# Patient Record
Sex: Female | Born: 1960 | Race: White | Hispanic: No | Marital: Married | Smoking: Never smoker
Health system: Southern US, Community
[De-identification: ages and names within clinical notes are randomized; demographics above are authoritative.]

## PROBLEM LIST (undated history)

## (undated) DIAGNOSIS — M1990 Unspecified osteoarthritis, unspecified site: Secondary | ICD-10-CM

## (undated) DIAGNOSIS — K219 Gastro-esophageal reflux disease without esophagitis: Secondary | ICD-10-CM

## (undated) HISTORY — PX: TONSILLECTOMY: SUR1361

---

## 2009-12-10 ENCOUNTER — Ambulatory Visit: Payer: Self-pay | Admitting: Gastroenterology

## 2009-12-24 ENCOUNTER — Ambulatory Visit: Payer: Self-pay | Admitting: Gastroenterology

## 2015-06-10 DIAGNOSIS — Z8639 Personal history of other endocrine, nutritional and metabolic disease: Secondary | ICD-10-CM | POA: Insufficient documentation

## 2016-11-21 ENCOUNTER — Inpatient Hospital Stay
Admission: EM | Admit: 2016-11-21 | Discharge: 2016-11-24 | DRG: 287 | Disposition: A | Discharge status: Home / Self Care | Payer: BLUE CROSS/BLUE SHIELD | Attending: Internal Medicine | Admitting: Internal Medicine

## 2016-11-21 ENCOUNTER — Encounter: Payer: Self-pay | Admitting: Emergency Medicine

## 2016-11-21 ENCOUNTER — Emergency Department: Payer: BLUE CROSS/BLUE SHIELD

## 2016-11-21 DIAGNOSIS — R079 Chest pain, unspecified: Secondary | ICD-10-CM | POA: Diagnosis present

## 2016-11-21 DIAGNOSIS — I400 Infective myocarditis: Secondary | ICD-10-CM | POA: Diagnosis not present

## 2016-11-21 DIAGNOSIS — I214 Non-ST elevation (NSTEMI) myocardial infarction: Secondary | ICD-10-CM | POA: Diagnosis present

## 2016-11-21 DIAGNOSIS — I441 Atrioventricular block, second degree: Secondary | ICD-10-CM | POA: Diagnosis present

## 2016-11-21 DIAGNOSIS — R109 Unspecified abdominal pain: Secondary | ICD-10-CM

## 2016-11-21 DIAGNOSIS — R030 Elevated blood-pressure reading, without diagnosis of hypertension: Secondary | ICD-10-CM | POA: Diagnosis present

## 2016-11-21 DIAGNOSIS — B9789 Other viral agents as the cause of diseases classified elsewhere: Secondary | ICD-10-CM | POA: Diagnosis present

## 2016-11-21 LAB — URINALYSIS, COMPLETE (UACMP) WITH MICROSCOPIC
Bacteria, UA: NONE SEEN
Bilirubin Urine: NEGATIVE
GLUCOSE, UA: NEGATIVE mg/dL
Hgb urine dipstick: NEGATIVE
Ketones, ur: 80 mg/dL — AB
Nitrite: NEGATIVE
PROTEIN: NEGATIVE mg/dL
Specific Gravity, Urine: 1.017 (ref 1.005–1.030)
pH: 7 (ref 5.0–8.0)

## 2016-11-21 LAB — CBC
HEMATOCRIT: 43 % (ref 35.0–47.0)
Hemoglobin: 14.3 g/dL (ref 12.0–16.0)
MCH: 29 pg (ref 26.0–34.0)
MCHC: 33.3 g/dL (ref 32.0–36.0)
MCV: 87 fL (ref 80.0–100.0)
Platelets: 206 10*3/uL (ref 150–440)
RBC: 4.94 MIL/uL (ref 3.80–5.20)
RDW: 12.9 % (ref 11.5–14.5)
WBC: 4.5 10*3/uL (ref 3.6–11.0)

## 2016-11-21 LAB — BASIC METABOLIC PANEL
Anion gap: 9 (ref 5–15)
BUN: 11 mg/dL (ref 6–20)
CALCIUM: 9.1 mg/dL (ref 8.9–10.3)
CO2: 27 mmol/L (ref 22–32)
CREATININE: 0.72 mg/dL (ref 0.44–1.00)
Chloride: 103 mmol/L (ref 101–111)
GFR calc Af Amer: >60 mL/min (ref >60)
GLUCOSE: 112 mg/dL — AB (ref 65–99)
Potassium: 3.8 mmol/L (ref 3.5–5.1)
Sodium: 139 mmol/L (ref 135–145)

## 2016-11-21 LAB — HEPATIC FUNCTION PANEL
ALT: 31 U/L (ref 14–54)
AST: 34 U/L (ref 15–41)
Albumin: 4.3 g/dL (ref 3.5–5.0)
Alkaline Phosphatase: 48 U/L (ref 38–126)
BILIRUBIN DIRECT: 0.2 mg/dL (ref 0.1–0.5)
Indirect Bilirubin: 1 mg/dL — ABNORMAL HIGH (ref 0.3–0.9)
Total Bilirubin: 1.2 mg/dL (ref 0.3–1.2)
Total Protein: 7.4 g/dL (ref 6.5–8.1)

## 2016-11-21 LAB — TROPONIN I
Troponin I: 0.22 ng/mL (ref <0.03)
Troponin I: 2.59 ng/mL (ref <0.03)

## 2016-11-21 LAB — LIPASE, BLOOD: Lipase: 33 U/L (ref 11–51)

## 2016-11-21 LAB — TSH: TSH: 0.471 u[IU]/mL (ref 0.350–4.500)

## 2016-11-21 MED ORDER — ASPIRIN 81 MG PO CHEW
CHEWABLE_TABLET | ORAL | Status: AC
  Administered 2016-11-21: 324 mg via ORAL
  Filled 2016-11-21: qty 4

## 2016-11-21 MED ORDER — ENOXAPARIN SODIUM 100 MG/ML ~~LOC~~ SOLN
SUBCUTANEOUS | Status: AC
  Filled 2016-11-21: qty 1

## 2016-11-21 MED ORDER — ACETAMINOPHEN 325 MG PO TABS
650.0000 mg | ORAL_TABLET | Freq: Four times a day (QID) | ORAL | Status: DC | PRN
  Administered 2016-11-22 – 2016-11-23 (×3): 650 mg via ORAL
  Filled 2016-11-21 (×3): qty 2

## 2016-11-21 MED ORDER — ENOXAPARIN SODIUM 80 MG/0.8ML ~~LOC~~ SOLN
1.0000 mg/kg | Freq: Two times a day (BID) | SUBCUTANEOUS | Status: DC
  Administered 2016-11-21: 75 mg via SUBCUTANEOUS

## 2016-11-21 MED ORDER — METOPROLOL TARTRATE 25 MG PO TABS
ORAL_TABLET | ORAL | Status: AC
  Administered 2016-11-21: 12.5 mg via ORAL
  Filled 2016-11-21: qty 1

## 2016-11-21 MED ORDER — ONDANSETRON HCL 4 MG/2ML IJ SOLN
4.0000 mg | Freq: Four times a day (QID) | INTRAMUSCULAR | Status: DC | PRN
  Administered 2016-11-21: 4 mg via INTRAVENOUS
  Filled 2016-11-21 (×2): qty 2

## 2016-11-21 MED ORDER — SODIUM CHLORIDE 0.9% FLUSH
3.0000 mL | INTRAVENOUS | Status: DC | PRN
  Administered 2016-11-21: 3 mL via INTRAVENOUS
  Filled 2016-11-21: qty 3

## 2016-11-21 MED ORDER — METOPROLOL TARTRATE 25 MG PO TABS
12.5000 mg | ORAL_TABLET | Freq: Two times a day (BID) | ORAL | Status: DC
  Administered 2016-11-21 – 2016-11-23 (×3): 12.5 mg via ORAL
  Filled 2016-11-21 (×5): qty 1

## 2016-11-21 MED ORDER — ACETAMINOPHEN 325 MG PO TABS: 650.0000 mg | ORAL_TABLET | Freq: Four times a day (QID) | ORAL | Status: DC | PRN

## 2016-11-21 MED ORDER — NITROGLYCERIN 0.4 MG SL SUBL
0.4000 mg | SUBLINGUAL_TABLET | SUBLINGUAL | Status: DC | PRN
Start: 2016-11-21 — End: 2016-11-24

## 2016-11-21 MED ORDER — ASPIRIN EC 325 MG PO TBEC
325.0000 mg | DELAYED_RELEASE_TABLET | Freq: Every day | ORAL | Status: DC
  Administered 2016-11-22 – 2016-11-24 (×3): 325 mg via ORAL
  Filled 2016-11-21 (×3): qty 1

## 2016-11-21 MED ORDER — SODIUM CHLORIDE 0.9% FLUSH
3.0000 mL | Freq: Two times a day (BID) | INTRAVENOUS | Status: DC
  Administered 2016-11-22 (×2): 3 mL via INTRAVENOUS

## 2016-11-21 MED ORDER — ONDANSETRON HCL 4 MG/2ML IJ SOLN
4.0000 mg | Freq: Once | INTRAMUSCULAR | Status: AC
  Administered 2016-11-21: 4 mg via INTRAVENOUS
  Filled 2016-11-21: qty 2

## 2016-11-21 MED ORDER — ENOXAPARIN SODIUM 80 MG/0.8ML ~~LOC~~ SOLN
1.0000 mg/kg | Freq: Two times a day (BID) | SUBCUTANEOUS | Status: DC
  Administered 2016-11-22 – 2016-11-23 (×3): 75 mg via SUBCUTANEOUS
  Filled 2016-11-21 (×3): qty 0.8

## 2016-11-21 MED ORDER — ACETAMINOPHEN 650 MG RE SUPP: 650.0000 mg | Freq: Four times a day (QID) | RECTAL | Status: DC | PRN

## 2016-11-21 MED ORDER — SODIUM CHLORIDE 0.9 % IV SOLN: 250.0000 mL | INTRAVENOUS | Status: DC | PRN

## 2016-11-21 MED ORDER — ASPIRIN 81 MG PO CHEW
324.0000 mg | CHEWABLE_TABLET | Freq: Once | ORAL | Status: AC
Start: 2016-11-21 — End: 2016-11-21
  Administered 2016-11-21: 324 mg via ORAL

## 2016-11-21 MED ORDER — SODIUM CHLORIDE 0.9% FLUSH: 3.0000 mL | Freq: Two times a day (BID) | INTRAVENOUS | Status: DC

## 2016-11-21 MED ORDER — SODIUM CHLORIDE 0.9 % IV BOLUS (SEPSIS)
1000.0000 mL | Freq: Once | INTRAVENOUS | Status: AC
  Administered 2016-11-21: 1000 mL via INTRAVENOUS

## 2016-11-21 NOTE — ED Notes (Signed)
Patient transported to Ultrasound 

## 2016-11-21 NOTE — ED Provider Notes (Addendum)
[facility] Emergency Department Provider Note  ____________________________________________   I have reviewed the triage vital signs and the nursing notes.   HISTORY  Chief Complaint Flank Pain and Chest Pain    HPI Rhonda Monroe is a 56 y.o. female was completed a healthy, does not take any medications not smoke does not use recreational drugs, does not drink alcohol, she has no allergies. She states that she has had multiple different symptoms over the last several days. She had flulike symptoms with runny nose and sore throat cough and that has persisted but seems to be gradually improving. She denies any shortness of breath or leg swelling. She states that she also has had some pain in the right flank and underneath some minimal discomfort in the right upper quadrant which seem to radiate towards her chest earlier today. It happened around 10:30. The pain was a a sharp/pressure sensation. It is gone now. She was still having when she had her EKG however. It began around noon. She denies any pleuritic component to this, no leg swelling, no personal or family history of PE or DVT no recent travel, no birth control her exogenous estrogens, she has no pleuritic pain or shortness of breath. It happened at rest. Patient states that she has still had some pain in her bilateral lower back but no dysuria no urinary symptoms of any variety. Patient states that the pain in her chest was better after she drank some water. She did vomit this morning 2 and she thinks that may also cause some of her discomfort. In short, multiple different complaints from body aches to runny nose to cough to chest pain to abdominal pain to flank pain      No past medical history on file.  There are no active problems to display for this patient.   Past Surgical History:  Procedure Laterality Date  . CESAREAN SECTION    . TONSILLECTOMY      Prior to Admission medications   Not on  File    Allergies Patient has no known allergies.  No family history on file.  Social History Social History  Substance Use Topics  . Smoking status: Never Smoker  . Smokeless tobacco: Not on file  . Alcohol use No    Review of Systems Constitutional: No fever/chills Eyes: No visual changes. ENT: Slight sore throat. No stiff neck no neck pain Cardiovascular: See history of present illness Respiratory: Denies shortness of breath. Gastrointestinal:   Positive vomiting.  No diarrhea.  No constipation. Genitourinary: Negative for dysuria. Musculoskeletal: Negative lower extremity swelling Skin: Negative for rash. Neurological: Negative for severe headaches, focal weakness or numbness. 10-point ROS otherwise negative.  ____________________________________________   PHYSICAL EXAM:  VITAL SIGNS: ED Triage Vitals  Enc Vitals Group     BP 11/21/16 1218 (!) 143/91     Pulse Rate 11/21/16 1218 78     Resp 11/21/16 1218 18     Temp 11/21/16 1218 98.2 F (36.8 C)     Temp Source 11/21/16 1218 Oral     SpO2 11/21/16 1218 100 %     Weight 11/21/16 1221 170 lb (77.1 kg)     Height 11/21/16 1221 5\' 5"  (1.651 m)     Head Circumference --      Peak Flow --      Pain Score 11/21/16 1221 7     Pain Loc --      Pain Edu? --  Excl. in [place]? --     Constitutional: Alert and oriented. Well appearing and in no acute distress.Vision quite well-appearing with no evidence of any discomfort or medical illness nose at this time. Eyes: Conjunctivae are normal. PERRL. EOMI. Head: Atraumatic. Nose: Slight clear congestion/rhinnorhea. Mouth/Throat: Mucous membranes are moist.  Oropharynx non-erythematous. Neck: No stridor.   Nontender with no meningismus Cardiovascular: Normal rate, regular rhythm. Grossly normal heart sounds.  Good peripheral circulation. Respiratory: Normal respiratory effort.  No retractions. Lungs CTAB. Abdominal: Soft and mild epigastric discomfort which reproduces  her pain also some slight sensation of "fullness" according to the patient on the right upper quadrant. No distention. No guarding no rebound Back:  There is no focal tenderness or step off.  there is no midline tenderness there are no lesions noted. there is no CVA tenderness Musculoskeletal: No lower extremity tenderness, no upper extremity tenderness. No joint effusions, no DVT signs strong distal pulses no edema Neurologic:  Normal speech and language. No gross focal neurologic deficits are appreciated.  Skin:  Skin is warm, dry and intact. No rash noted. Psychiatric: Mood and affect are normal. Speech and behavior are normal.  ____________________________________________   LABS (all labs ordered are listed, but only abnormal results are displayed)  Labs Reviewed  BASIC METABOLIC PANEL - Abnormal; Notable for the following:       Result Value   Glucose, Bld 112 (*)    All other components within normal limits  URINALYSIS, COMPLETE (UACMP) WITH MICROSCOPIC - Abnormal; Notable for the following:    Color, Urine YELLOW (*)    APPearance CLEAR (*)    Ketones, ur 80 (*)    Leukocytes, UA SMALL (*)    Squamous Epithelial / LPF 0-5 (*)    All other components within normal limits  HEPATIC FUNCTION PANEL - Abnormal; Notable for the following:    Indirect Bilirubin 1.0 (*)    All other components within normal limits  CBC  TROPONIN I  LIPASE, BLOOD   ____________________________________________  EKG  I personally interpreted any EKGs ordered by me or triage Normal sinus rhythm rate 77 bpm no acute ST elevation or acute ST depression normal axis unremarkable EKG ____________________________________________  RADIOLOGY  I reviewed any imaging ordered by me or triage that were performed during my shift and, if possible, patient and/or family made aware of any abnormal findings. ____________________________________________   PROCEDURES  Procedure(s) performed:  None  Procedures  Critical Care performed: None  ____________________________________________   INITIAL IMPRESSION / ASSESSMENT AND PLAN / ED COURSE  Pertinent labs & imaging results that were available during my care of the patient were reviewed by me and considered in my medical decision making (see chart for details).  Patient with a host of different complaints today. Body aches, flulike symptoms, runny nose, sore throat, slight cough, lower back pain more on the right than the left, as well as a epigastric and right upper quadrant abdominal discomfort that went towards her chest. She is concerned that it might be her gallbladder. Patient's blood work and vital signs are reassuring. We have added liver function tests and lipase. She has minimal discomfort or epigastric region which does seem to reproduce the pain that she was having. Very low suspicion for ACS given normal EKG normal troponin and no risk factors with a very atypical pain at rest which seemed to come from her stomach. Nonetheless we'll send a second cardiac enzyme.At this time, there does not appear to be clinical evidence to  support the diagnosis of pulmonary embolus, dissection, myocarditis, endocarditis, pericarditis, pericardial tamponade, acute coronary syndrome, pneumothorax, pneumonia, or any other acute intrathoracic pathology that will require admission or acute intervention. Nor is there evidence of any significant intra-abdominal pathology causing this discomfort. Patient with no significant discomfort to her abdomen at this time a limited possibility exists that this could be gallbladder disease which is been nonradiating brace directions, we'll obtain an ultrasound. We see no evidence of anything aside from some (in her urine aside from trace leukocytes. Certainly no evidence of pyelonephritis, or renal colic, abdomen is benign no evidence of appendicitis. We will monitor the patient closely and  reassess.Marland Kitchen  ----------------------------------------- 5:23 PM on 11/21/2016 -----------------------------------------  She remains asymptomatic however her second troponin was 0000000 which is certainly elevated from baseline. She has no chest pain at this time. I do not suspect PE or dissection. She did not have any shortness of breath with this and was not tearing pain did not go to her back. She does remain chest pain-free nonetheless given this change, we will have her admitted I have discussed with the hospitalist and they agree.   ____________________________________________   FINAL CLINICAL IMPRESSION(S) / ED DIAGNOSES  Final diagnoses:  Abdominal pain      This chart was dictated using voice recognition software.  Despite best efforts to proofread,  errors can occur which can change meaning.      Schuyler Amor, MD 11/21/16 [place], MD 11/21/16 [phone]

## 2016-11-21 NOTE — ED Triage Notes (Signed)
Pt c/o flu like symptoms and R sided flank pain that started on Sunday. Pt c/o nausea with the flank pain. States she went to [facility] this morning and had sudden onset substernal chest pain with no radiation, c/o some dizziness and light headed-ness at this time.

## 2016-11-21 NOTE — H&P (Signed)
[facility] [place] NAME: Rhonda Monroe    MR#:  [id number]  DATE OF BIRTH:  1960-10-25  DATE OF ADMISSION:  11/21/2016  PRIMARY CARE PHYSICIAN: Rusty Aus, MD   REQUESTING/REFERRING PHYSICIAN: Leilani Merl MD  CHIEF COMPLAINT:   Chief Complaint  Patient presents with  . Flank Pain  . Chest Pain    HISTORY OF PRESENT ILLNESS: Rhonda Monroe  is a 56 y.o. female with No medical history according to her and not on any medications who states that she had flulike illness since last Sunday. Which included stuffy nose and congestion. As well as fevers. She also states that she had pain in her kidneys. Her fevers have resolved after 2-3 days. However subsequently she became nauseous. She did not have any vomiting. Then she started having again flank pain and then started having chest pressure today. Therefore she came to the ED. Now the chest pressure is resolved. Her troponin was 0 and then subsequently elevated  .22. Therefore we are asked to admit the patient. She states that she she has had substernal chest pressure occasionally but is very rare. She is pretty active and does not get these symptoms with exertion. PAST MEDICAL HISTORY:  No past medical history on file.  PAST SURGICAL HISTORY:  Past Surgical History:  Procedure Laterality Date  . CESAREAN SECTION    . TONSILLECTOMY      SOCIAL HISTORY:  Social History  Substance Use Topics  . Smoking status: Never Smoker  . Smokeless tobacco: Never Used  . Alcohol use No    FAMILY HISTORY:  Family History  Problem Relation Age of Onset  . Dementia Mother     DRUG ALLERGIES: No Known Allergies  REVIEW OF SYSTEMS:   CONSTITUTIONAL: No fever, fatigue or weakness.  EYES: No blurred or double vision.  EARS, NOSE, AND THROAT: No tinnitus or ear pain.  RESPIRATORY: No cough, shortness of breath, wheezing or hemoptysis.  CARDIOVASCULAR: Positive chest pain earlier, orthopnea, edema.   GASTROINTESTINAL: No nausea, vomiting, diarrhea or abdominal pain.  GENITOURINARY: No dysuria, hematuria.  ENDOCRINE: No polyuria, nocturia,  HEMATOLOGY: No anemia, easy bruising or bleeding SKIN: No rash or lesion. MUSCULOSKELETAL: No joint pain or arthritis.   NEUROLOGIC: No tingling, numbness, weakness.  PSYCHIATRY: No anxiety or depression.   MEDICATIONS AT HOME:  Prior to Admission medications   Not on File      PHYSICAL EXAMINATION:   VITAL SIGNS: Blood pressure 134/85, pulse 74, temperature 98.1 F (36.7 C), temperature source Oral, resp. rate 18, height 5\' 5"  (1.651 m), weight 170 lb (77.1 kg), SpO2 98 %.  GENERAL:  56 y.o.-year-old patient lying in the bed with no acute distress.  EYES: Pupils equal, round, reactive to light and accommodation. No scleral icterus. Extraocular muscles intact.  HEENT: Head atraumatic, normocephalic. Oropharynx and nasopharynx clear.  NECK:  Supple, no jugular venous distention. No thyroid enlargement, no tenderness.  LUNGS: Normal breath sounds bilaterally, no wheezing, rales,rhonchi or crepitation. No use of accessory muscles of respiration.  CARDIOVASCULAR: S1, S2 normal. No murmurs, rubs, or gallops.  ABDOMEN: Soft, nontender, nondistended. Bowel sounds present. No organomegaly or mass.  EXTREMITIES: No pedal edema, cyanosis, or clubbing.  NEUROLOGIC: Cranial nerves II through XII are intact. Muscle strength 5/5 in all extremities. Sensation intact. Gait not checked.  PSYCHIATRIC: The patient is alert and oriented x 3.  SKIN: No obvious rash, lesion, or ulcer.   LABORATORY PANEL:   CBC  Recent Labs Lab 11/21/16 1219  WBC 4.5  HGB 14.3  HCT 43.0  PLT 206  MCV 87.0  MCH 29.0  MCHC 33.3  RDW 12.9   ------------------------------------------------------------------------------------------------------------------  Chemistries   Recent Labs Lab 11/21/16 1219  NA 139  K 3.8  CL 103  CO2 27  GLUCOSE 112*  BUN 11   CREATININE 0.72  CALCIUM 9.1  AST 34  ALT 31  ALKPHOS 48  BILITOT 1.2   ------------------------------------------------------------------------------------------------------------------ estimated creatinine clearance is 79.6 mL/min (by C-G formula based on SCr of 0.72 mg/dL). ------------------------------------------------------------------------------------------------------------------ No results for input(s): TSH, T4TOTAL, T3FREE, THYROIDAB in the last 72 hours.  Invalid input(s): FREET3   Coagulation profile No results for input(s): INR, PROTIME in the last 168 hours. ------------------------------------------------------------------------------------------------------------------- No results for input(s): DDIMER in the last 72 hours. -------------------------------------------------------------------------------------------------------------------  Cardiac Enzymes  Recent Labs Lab 11/21/16 1219 11/21/16 1628  TROPONINI <0.03 0.22*   ------------------------------------------------------------------------------------------------------------------ Invalid input(s): POCBNP  ---------------------------------------------------------------------------------------------------------------  Urinalysis    Component Value Date/Time   COLORURINE YELLOW (A) 11/21/2016 1219   APPEARANCEUR CLEAR (A) 11/21/2016 1219   LABSPEC 1.017 11/21/2016 1219   PHURINE 7.0 11/21/2016 [place] 11/21/2016 1219   HGBUR NEGATIVE 11/21/2016 1219   BILIRUBINUR NEGATIVE 11/21/2016 1219   KETONESUR 80 (A) 11/21/2016 1219   PROTEINUR NEGATIVE 11/21/2016 1219   NITRITE NEGATIVE 11/21/2016 1219   LEUKOCYTESUR SMALL (A) 11/21/2016 1219     RADIOLOGY: Dg Chest 2 View  Result Date: 11/21/2016 CLINICAL DATA:  Pt c/o flu like symptoms and R sided flank pain that started on Sunday. Pt c/o nausea with the flank pain. EXAM: CHEST  2 VIEW COMPARISON:  None. FINDINGS: The heart size and  mediastinal contours are within normal limits. Both lungs are clear. The visualized skeletal structures are unremarkable. IMPRESSION: No active cardiopulmonary disease. Electronically Signed   By: Kathreen Devoid   On: 11/21/2016 12:52   US Abdomen Limited Ruq  Result Date: 11/21/2016 CLINICAL DATA:  Abdominal pain EXAM: US ABDOMEN LIMITED - RIGHT UPPER QUADRANT COMPARISON:  None. FINDINGS: Gallbladder: No gallstones or wall thickening visualized. No sonographic Murphy sign noted by sonographer. Common bile duct: Diameter: 2.5 mm Liver: No focal lesion identified. Within normal limits in parenchymal echogenicity. IMPRESSION: Normal study. Electronically Signed   By: Dorise Bullion III M.D   On: 11/21/2016 15:56    EKG: Orders placed or performed during the hospital encounter of 11/21/16  . EKG 12-Lead  . EKG 12-Lead  . ED EKG within 10 minutes  . ED EKG within 10 minutes    IMPRESSION AND PLAN: Patient is a 56 year old presented with chest pain noted to have troponin elevation  1. Chest pain with elevated troponin Could be related to underlying coronary artery disease and angina. We'll treat with aspirin, full dose Lovenox, low-dose beta blocker I will ask cardiology to see Obtain echocardiogram of the heart We'll place under observation overnight Check a fasting lipid panel, hemoglobin A1c   All the records are reviewed and case discussed with ED provider. Management plans discussed with the patient, family and they are in agreement.  CODE STATUS: Code Status History    This patient does not have a recorded code status. Please follow your organizational policy for patients in this situation.       TOTAL TIME TAKING CARE OF THIS PATIENT: 50 minutes.    Dustin Flock M.D on 11/21/2016 at 5:57 PM  Between 7am to 6pm - Pager - [phone]  After 6pm go  to [email or web address] - password [email or web address] [facility]  Office  [phone]  CC: Primary care physician;  Rusty Aus, MD

## 2016-11-21 NOTE — Progress Notes (Signed)
Dr. Jannifer Franklin notified of elevated troponin 2.59. Orders placed

## 2016-11-21 NOTE — ED Notes (Signed)
Lab alerted me of abnormal Trop of 0.22, MD notified.

## 2016-11-22 ENCOUNTER — Encounter: Payer: Self-pay | Admitting: Internal Medicine

## 2016-11-22 ENCOUNTER — Observation Stay
Admit: 2016-11-22 | Discharge: 2016-11-22 | Disposition: A | Discharge status: Home / Self Care | Payer: BLUE CROSS/BLUE SHIELD | Attending: Internal Medicine | Admitting: Internal Medicine

## 2016-11-22 DIAGNOSIS — I400 Infective myocarditis: Secondary | ICD-10-CM | POA: Diagnosis present

## 2016-11-22 DIAGNOSIS — I441 Atrioventricular block, second degree: Secondary | ICD-10-CM | POA: Diagnosis present

## 2016-11-22 DIAGNOSIS — B9789 Other viral agents as the cause of diseases classified elsewhere: Secondary | ICD-10-CM | POA: Diagnosis present

## 2016-11-22 DIAGNOSIS — R030 Elevated blood-pressure reading, without diagnosis of hypertension: Secondary | ICD-10-CM | POA: Diagnosis present

## 2016-11-22 DIAGNOSIS — I214 Non-ST elevation (NSTEMI) myocardial infarction: Secondary | ICD-10-CM | POA: Diagnosis present

## 2016-11-22 DIAGNOSIS — R109 Unspecified abdominal pain: Secondary | ICD-10-CM | POA: Diagnosis present

## 2016-11-22 LAB — CBC
HEMATOCRIT: 40.4 % (ref 35.0–47.0)
Hemoglobin: 14 g/dL (ref 12.0–16.0)
MCH: 29.9 pg (ref 26.0–34.0)
MCHC: 34.6 g/dL (ref 32.0–36.0)
MCV: 86.6 fL (ref 80.0–100.0)
Platelets: 199 10*3/uL (ref 150–440)
RBC: 4.67 MIL/uL (ref 3.80–5.20)
RDW: 12.8 % (ref 11.5–14.5)
WBC: 5.2 10*3/uL (ref 3.6–11.0)

## 2016-11-22 LAB — LIPID PANEL
CHOLESTEROL: 152 mg/dL (ref 0–200)
HDL: 34 mg/dL — ABNORMAL LOW (ref >40)
LDL CALC: 97 mg/dL (ref 0–99)
TRIGLYCERIDES: 106 mg/dL (ref <150)
Total CHOL/HDL Ratio: 4.5 RATIO
VLDL: 21 mg/dL (ref 0–40)

## 2016-11-22 LAB — BASIC METABOLIC PANEL
ANION GAP: 9 (ref 5–15)
BUN: 8 mg/dL (ref 6–20)
CALCIUM: 8.6 mg/dL — AB (ref 8.9–10.3)
CO2: 25 mmol/L (ref 22–32)
Chloride: 104 mmol/L (ref 101–111)
Creatinine, Ser: 0.66 mg/dL (ref 0.44–1.00)
Glucose, Bld: 100 mg/dL — ABNORMAL HIGH (ref 65–99)
Potassium: 3.7 mmol/L (ref 3.5–5.1)
Sodium: 138 mmol/L (ref 135–145)

## 2016-11-22 LAB — TROPONIN I
TROPONIN I: 5.3 ng/mL — AB (ref <0.03)
TROPONIN I: 5.93 ng/mL — AB (ref <0.03)
TROPONIN I: 4.43 ng/mL — AB (ref <0.03)

## 2016-11-22 MED ORDER — ATORVASTATIN CALCIUM 20 MG PO TABS
40.0000 mg | ORAL_TABLET | Freq: Every day | ORAL | Status: DC
  Administered 2016-11-22 – 2016-11-23 (×2): 40 mg via ORAL
  Filled 2016-11-22 (×2): qty 2

## 2016-11-22 MED ORDER — SODIUM CHLORIDE 0.9 % WEIGHT BASED INFUSION: 1.0000 mL/kg/h | INTRAVENOUS | Status: DC

## 2016-11-22 MED ORDER — ASPIRIN 81 MG PO CHEW
81.0000 mg | CHEWABLE_TABLET | ORAL | Status: AC
  Administered 2016-11-23: 81 mg via ORAL
  Filled 2016-11-22: qty 1

## 2016-11-22 MED ORDER — SODIUM CHLORIDE 0.9 % WEIGHT BASED INFUSION: 3.0000 mL/kg/h | INTRAVENOUS | Status: DC

## 2016-11-22 MED ORDER — SODIUM CHLORIDE 0.9 % IV SOLN: 250.0000 mL | INTRAVENOUS | Status: DC | PRN

## 2016-11-22 MED ORDER — METOCLOPRAMIDE HCL 5 MG/ML IJ SOLN
5.0000 mg | Freq: Three times a day (TID) | INTRAMUSCULAR | Status: DC | PRN
  Administered 2016-11-22: 5 mg via INTRAVENOUS
  Filled 2016-11-22: qty 2

## 2016-11-22 MED ORDER — SODIUM CHLORIDE 0.9% FLUSH: 3.0000 mL | INTRAVENOUS | Status: DC | PRN

## 2016-11-22 MED ORDER — SODIUM CHLORIDE 0.9% FLUSH
3.0000 mL | Freq: Two times a day (BID) | INTRAVENOUS | Status: DC
  Administered 2016-11-22: 3 mL via INTRAVENOUS

## 2016-11-22 NOTE — Consult Note (Signed)
[facility] Consultation Note  Patient ID: Rhonda Monroe, MRN: [id number], DOB/AGE: [birth year] 56 y.o. Admit date: 11/21/2016   Date of Consult: 11/22/2016 Primary Physician: Rusty Aus, MD Primary Cardiologist: None  Chief Complaint:  Chief Complaint  Patient presents with  . Flank Pain  . Chest Pain   Reason for Consult: chest pain  HPI: 56 y.o. female with no previous history of cardiovascular disease or any significant history of cardiovascular risk factors who is had a viral type illness with weakness fatigue and other concerns but no evidence of fever over the last week. With this she had some significant lower back pain as well as increase in central chest discomfort with and without shortness of breath and with and without activity over a several hour period. The patient was seen in the emergency room with the and EKG showing normal sinus rhythm and full resolution of her chest discomfort with rest. She was placed on heparin for which she has had full resolution of symptoms and is hemodynamically stable. The patient did have an elevated troponin of 5.9 consistent with non-ST elevation myocardial infarction. Currently she is stable and feeling well  No past medical history on file.    Surgical History:  Past Surgical History:  Procedure Laterality Date  . CESAREAN SECTION    . TONSILLECTOMY       Home Meds: Prior to Admission medications   Not on File    Inpatient Medications:  . aspirin EC  325 mg Oral Daily  . enoxaparin (LOVENOX) injection  1 mg/kg Subcutaneous Q12H  . metoprolol tartrate  12.5 mg Oral BID  . sodium chloride flush  3 mL Intravenous Q12H  . sodium chloride flush  3 mL Intravenous Q12H     Allergies: No Known Allergies  Social History   Social History  . Marital status: Married    Spouse name: N/A  . Number of children: N/A  . Years of education: N/A   Occupational History  . Not on file.   Social History Main Topics  .  Smoking status: Never Smoker  . Smokeless tobacco: Never Used  . Alcohol use No  . Drug use: No  . Sexual activity: Not on file   Other Topics Concern  . Not on file   Social History Narrative  . No narrative on file     Family History  Problem Relation Age of Onset  . Dementia Mother      Review of Systems Positive for Chest pain shortness of breath weakness fatigue Negative for: General:  chills, fever, night sweats or weight changes.  Cardiovascular: PND orthopnea syncope dizziness  Dermatological skin lesions rashes Respiratory: Cough congestion Urologic: Frequent urination urination at night and hematuria Abdominal: negative for nausea, vomiting, diarrhea, bright red blood per rectum, melena, or hematemesis Neurologic: negative for visual changes, and/or hearing changes  All other systems reviewed and are otherwise negative except as noted above.  Labs:  Recent Labs  11/21/16 1219 11/21/16 1628 11/21/16 2045 11/22/16 0306  TROPONINI <0.03 0.22* 2.59* 5.30*   Lab Results  Component Value Date   WBC 5.2 11/22/2016   HGB 14.0 11/22/2016   HCT 40.4 11/22/2016   MCV 86.6 11/22/2016   PLT 199 11/22/2016    Recent Labs Lab 11/21/16 1219 11/22/16 0306  NA 139 138  K 3.8 3.7  CL 103 104  CO2 27 25  BUN 11 8  CREATININE 0.72 0.66  CALCIUM 9.1 8.6*  PROT 7.4  --  BILITOT 1.2  --   ALKPHOS 48  --   ALT 31  --   AST 34  --   GLUCOSE 112* 100*   Lab Results  Component Value Date   CHOL 152 11/22/2016   HDL 34 (L) 11/22/2016   LDLCALC 97 11/22/2016   TRIG 106 11/22/2016   No results found for: DDIMER  Radiology/Studies:  Dg Chest 2 View  Result Date: 11/21/2016 CLINICAL DATA:  Pt c/o flu like symptoms and R sided flank pain that started on Sunday. Pt c/o nausea with the flank pain. EXAM: CHEST  2 VIEW COMPARISON:  None. FINDINGS: The heart size and mediastinal contours are within normal limits. Both lungs are clear. The visualized skeletal  structures are unremarkable. IMPRESSION: No active cardiopulmonary disease. Electronically Signed   By: Kathreen Devoid   On: 11/21/2016 12:52   US Abdomen Limited Ruq  Result Date: 11/21/2016 CLINICAL DATA:  Abdominal pain EXAM: US ABDOMEN LIMITED - RIGHT UPPER QUADRANT COMPARISON:  None. FINDINGS: Gallbladder: No gallstones or wall thickening visualized. No sonographic Murphy sign noted by sonographer. Common bile duct: Diameter: 2.5 mm Liver: No focal lesion identified. Within normal limits in parenchymal echogenicity. IMPRESSION: Normal study. Electronically Signed   By: Dorise Bullion III M.D   On: 11/21/2016 15:56    EKG: Normal sinus rhythm  Weights: Filed Weights   11/21/16 1221 11/21/16 1938  Weight: 77.1 kg (170 lb) 75.7 kg (166 lb 12.8 oz)     Physical Exam: Blood pressure 139/83, pulse 70, temperature 98.1 F (36.7 C), temperature source Oral, resp. rate 18, height 5\' 5"  (1.651 m), weight 75.7 kg (166 lb 12.8 oz), SpO2 98 %. Body mass index is 27.76 kg/m. General: Well developed, well nourished, in no acute distress. Head eyes ears nose throat: Normocephalic, atraumatic, sclera non-icteric, no xanthomas, nares are without discharge. No apparent thyromegaly and/or mass  Lungs: Normal respiratory effort.  no wheezes, no rales, no rhonchi.  Heart: RRR with normal S1 S2. no murmur gallop, no rub, PMI is normal size and placement, carotid upstroke normal without bruit, jugular venous pressure is normal Abdomen: Soft, non-tender, non-distended with normoactive bowel sounds. No hepatomegaly. No rebound/guarding. No obvious abdominal masses. Abdominal aorta is normal size without bruit Extremities: No edema. no cyanosis, no clubbing, no ulcers  Peripheral : 2+ bilateral upper extremity pulses, 2+ bilateral femoral pulses, 2+ bilateral dorsal pedal pulse Neuro: Alert and oriented. No facial asymmetry. No focal deficit. Moves all extremities spontaneously. Musculoskeletal: Normal muscle  tone without kyphosis Psych:  Responds to questions appropriately with a normal affect.    Assessment: 56 year old female with no cardiovascular risk factors or history with elevated troponin after episode of chest discomfort possibly due to stress-induced cardiomyopathy, viral cardiomyopathy, and/or non-ST elevation myocardial infarction  Plan: 1. Continue heparin for further risk reduction in cardiovascular event 2. No additional medication management at this time due to normal blood pressure and full resolution of symptoms 3. Proceed to cardiac catheterization to assess coronary anatomy and further treatment thereof is necessary. Patient understands the risk and benefits of cardiac catheterization. This includes a possibility of death stroke heart attack infection bleeding or blood clot. She is at low risk for conscious sedation  Signed, Corey Skains M.D. [facility] 11/22/2016, 7:01 AM

## 2016-11-22 NOTE — Progress Notes (Signed)
*  PRELIMINARY RESULTS* Echocardiogram 2D Echocardiogram has been performed.  Rhonda Monroe 11/22/2016, 8:48 AM

## 2016-11-22 NOTE — Progress Notes (Signed)
Informed by central monitoring pt had 4.5 sec pause. Dr. Marcille Blanco Aware and reviewed strip.

## 2016-11-22 NOTE — Progress Notes (Signed)
Call from [place] monitoring following up regarding pause. Documented strip as ventricular standstill,

## 2016-11-22 NOTE — Progress Notes (Signed)
[facility] [place] NAME: Rhonda Monroe    MR#:  [id number]  DATE OF BIRTH:  1960-11-30  SUBJECTIVE:  CHIEF COMPLAINT:   Chief Complaint  Patient presents with  . Flank Pain  . Chest Pain   No chest pain today Nausea present without vomiting or abd pain  REVIEW OF SYSTEMS:    Review of Systems  Constitutional: Negative for chills and fever.  HENT: Negative for sore throat.   Eyes: Negative for blurred vision, double vision and pain.  Respiratory: Negative for cough, hemoptysis, shortness of breath and wheezing.   Cardiovascular: Negative for chest pain, palpitations, orthopnea and leg swelling.  Gastrointestinal: Positive for nausea. Negative for abdominal pain, constipation, diarrhea, heartburn and vomiting.  Genitourinary: Negative for dysuria and hematuria.  Musculoskeletal: Negative for back pain and joint pain.  Skin: Negative for rash.  Neurological: Negative for sensory change, speech change, focal weakness and headaches.  Endo/Heme/Allergies: Does not bruise/bleed easily.  Psychiatric/Behavioral: Negative for depression. The patient is not nervous/anxious.     DRUG ALLERGIES:  No Known Allergies  VITALS:  Blood pressure 123/79, pulse 70, temperature 97.7 F (36.5 C), temperature source Oral, resp. rate 18, height 5\' 5"  (1.651 m), weight 75.7 kg (166 lb 12.8 oz), SpO2 96 %.  PHYSICAL EXAMINATION:   Physical Exam  GENERAL:  56 y.o.-year-old patient lying in the bed with no acute distress.  EYES: Pupils equal, round, reactive to light and accommodation. No scleral icterus. Extraocular muscles intact.  HEENT: Head atraumatic, normocephalic. Oropharynx and nasopharynx clear.  NECK:  Supple, no jugular venous distention. No thyroid enlargement, no tenderness.  LUNGS: Normal breath sounds bilaterally, no wheezing, rales, rhonchi. No use of accessory muscles of respiration.  CARDIOVASCULAR: S1, S2 normal. No murmurs, rubs, or  gallops.  ABDOMEN: Soft, nontender, nondistended. Bowel sounds present. No organomegaly or mass.  EXTREMITIES: No cyanosis, clubbing or edema b/l.    NEUROLOGIC: Cranial nerves II through XII are intact. No focal Motor or sensory deficits b/l.   PSYCHIATRIC: The patient is alert and oriented x 3.  SKIN: No obvious rash, lesion, or ulcer.   LABORATORY PANEL:   CBC  Recent Labs Lab 11/22/16 0306  WBC 5.2  HGB 14.0  HCT 40.4  PLT 199   ------------------------------------------------------------------------------------------------------------------ Chemistries   Recent Labs Lab 11/21/16 1219 11/22/16 0306  NA 139 138  K 3.8 3.7  CL 103 104  CO2 27 25  GLUCOSE 112* 100*  BUN 11 8  CREATININE 0.72 0.66  CALCIUM 9.1 8.6*  AST 34  --   ALT 31  --   ALKPHOS 48  --   BILITOT 1.2  --    ------------------------------------------------------------------------------------------------------------------  Cardiac Enzymes  Recent Labs Lab 11/22/16 0851  TROPONINI 5.93*   ------------------------------------------------------------------------------------------------------------------  RADIOLOGY:  Dg Chest 2 View  Result Date: 11/21/2016 CLINICAL DATA:  Pt c/o flu like symptoms and R sided flank pain that started on Sunday. Pt c/o nausea with the flank pain. EXAM: CHEST  2 VIEW COMPARISON:  None. FINDINGS: The heart size and mediastinal contours are within normal limits. Both lungs are clear. The visualized skeletal structures are unremarkable. IMPRESSION: No active cardiopulmonary disease. Electronically Signed   By: Kathreen Devoid   On: 11/21/2016 12:52   US Abdomen Limited Ruq  Result Date: 11/21/2016 CLINICAL DATA:  Abdominal pain EXAM: US ABDOMEN LIMITED - RIGHT UPPER QUADRANT COMPARISON:  None. FINDINGS: Gallbladder: No gallstones or wall thickening visualized. No sonographic Murphy sign  noted by sonographer. Common bile duct: Diameter: 2.5 mm Liver: No focal lesion  identified. Within normal limits in parenchymal echogenicity. IMPRESSION: Normal study. Electronically Signed   By: Dorise Bullion III M.D   On: 11/21/2016 15:56     ASSESSMENT AND PLAN:   * NSTEMI - Troponin trending up ASA, Statin Lovenox Appreciate cardiology input Cath tomorrow Likely atypical presentation with Nausea  * Elevated blood pressure Monitor  All the records are reviewed and case discussed with Care Management/Social Workerr. Management plans discussed with the patient, family and they are in agreement.  CODE STATUS: FULL CODE  DVT Prophylaxis: SCDs  TOTAL TIME TAKING CARE OF THIS PATIENT: 30 minutes.   POSSIBLE D/C IN 1-2 DAYS, DEPENDING ON CLINICAL CONDITION.  Hillary Bow R M.D on 11/22/2016 at 11:23 AM  Between 7am to 6pm - Pager - [phone]  After 6pm go to [email or web address] - password [email or web address] [facility]  Office  [phone]  CC: Primary care physician; Rusty Aus, MD  Note: This dictation was prepared with Dragon dictation along with smaller phrase technology. Any transcriptional errors that result from this process are unintentional.

## 2016-11-22 NOTE — Care Management Obs Status (Signed)
[facility] NOTIFICATION   Patient Details  Name: ALIZAH PION MRN: [id number] Date of Birth: 20-Oct-1960   Medicare Observation Status Notification Given:  No (No MOON letter given per no Medicare)    Cierah Crader A, RN 11/22/2016, 3:31 PM

## 2016-11-23 ENCOUNTER — Encounter
Admission: EM | Disposition: A | Discharge status: Home / Self Care | Payer: Self-pay | Source: Home / Self Care | Attending: Internal Medicine

## 2016-11-23 HISTORY — PX: LEFT HEART CATH: CATH118248

## 2016-11-23 LAB — HEMOGLOBIN A1C
Hgb A1c MFr Bld: 5.5 % (ref 4.8–5.6)
MEAN PLASMA GLUCOSE: 111 mg/dL

## 2016-11-23 LAB — ECHOCARDIOGRAM COMPLETE
Height: 65 in
Weight: 2668.8 oz

## 2016-11-23 LAB — URINE CULTURE

## 2016-11-23 SURGERY — LEFT HEART CATH
Anesthesia: Moderate Sedation

## 2016-11-23 MED ORDER — METOPROLOL TARTRATE 25 MG PO TABS: 12.5000 mg | ORAL_TABLET | Freq: Two times a day (BID) | ORAL | 0 refills | Status: DC

## 2016-11-23 MED ORDER — MIDAZOLAM HCL 2 MG/2ML IJ SOLN
INTRAMUSCULAR | Status: DC | PRN
  Administered 2016-11-23: 1 mg via INTRAVENOUS

## 2016-11-23 MED ORDER — HEPARIN (PORCINE) IN NACL 2-0.9 UNIT/ML-% IJ SOLN
INTRAMUSCULAR | Status: AC
  Filled 2016-11-23: qty 500

## 2016-11-23 MED ORDER — ONDANSETRON HCL 4 MG/2ML IJ SOLN: 4.0000 mg | Freq: Four times a day (QID) | INTRAMUSCULAR | Status: DC | PRN

## 2016-11-23 MED ORDER — IOPAMIDOL (ISOVUE-300) INJECTION 61%
INTRAVENOUS | Status: DC | PRN
  Administered 2016-11-23: 105 mL via INTRA_ARTERIAL

## 2016-11-23 MED ORDER — ASPIRIN 325 MG PO TBEC: 325.0000 mg | DELAYED_RELEASE_TABLET | Freq: Every day | ORAL | 0 refills | Status: DC

## 2016-11-23 MED ORDER — FENTANYL CITRATE (PF) 100 MCG/2ML IJ SOLN
INTRAMUSCULAR | Status: DC | PRN
  Administered 2016-11-23: 25 ug via INTRAVENOUS

## 2016-11-23 MED ORDER — SODIUM CHLORIDE 0.9 % WEIGHT BASED INFUSION
1.0000 mL/kg/h | INTRAVENOUS | Status: AC
  Administered 2016-11-23: 1 mL/kg/h via INTRAVENOUS

## 2016-11-23 MED ORDER — FENTANYL CITRATE (PF) 100 MCG/2ML IJ SOLN
INTRAMUSCULAR | Status: AC
  Filled 2016-11-23: qty 2

## 2016-11-23 MED ORDER — ACETAMINOPHEN 325 MG PO TABS: 650.0000 mg | ORAL_TABLET | ORAL | Status: DC | PRN

## 2016-11-23 MED ORDER — SODIUM CHLORIDE 0.9% FLUSH
3.0000 mL | Freq: Two times a day (BID) | INTRAVENOUS | Status: DC
  Administered 2016-11-23 – 2016-11-24 (×2): 3 mL via INTRAVENOUS

## 2016-11-23 MED ORDER — SODIUM CHLORIDE 0.9% FLUSH
3.0000 mL | INTRAVENOUS | Status: DC | PRN
  Administered 2016-11-23: 3 mL via INTRAVENOUS
  Filled 2016-11-23: qty 3

## 2016-11-23 MED ORDER — ATORVASTATIN CALCIUM 40 MG PO TABS: 20.0000 mg | ORAL_TABLET | Freq: Every day | ORAL | 0 refills | Status: DC

## 2016-11-23 MED ORDER — MIDAZOLAM HCL 2 MG/2ML IJ SOLN
INTRAMUSCULAR | Status: AC
  Filled 2016-11-23: qty 2

## 2016-11-23 MED ORDER — SODIUM CHLORIDE 0.9 % IV SOLN: 250.0000 mL | INTRAVENOUS | Status: DC | PRN

## 2016-11-23 SURGICAL SUPPLY — 9 items
CATH 5FR JL4 DIAGNOSTIC (CATHETERS) ×2 IMPLANT
CATH 5FR JR4 DIAGNOSTIC (CATHETERS) ×3 IMPLANT
CATH INFINITI 5FR ANG PIGTAIL (CATHETERS) ×3 IMPLANT
DEVICE CLOSURE MYNXGRIP 5F (Vascular Products) ×3 IMPLANT
KIT MANI 3VAL PERCEP (MISCELLANEOUS) ×3 IMPLANT
NEEDLE PERC 18GX7CM (NEEDLE) ×3 IMPLANT
PACK CARDIAC CATH (CUSTOM PROCEDURE TRAY) ×6 IMPLANT
SHEATH PINNACLE 5F 10CM (SHEATH) ×3 IMPLANT
WIRE EMERALD 3MM-J .035X150CM (WIRE) ×3 IMPLANT

## 2016-11-23 NOTE — Progress Notes (Signed)
Report to [facility] telemetry.  Check right groin for bleeding or hematoma.  Patient will be on bedrest for 1 hours post sheath pull---out of bed at 09:30.  Bilateral pulses are 2's DP's.Marland Kitchen

## 2016-11-23 NOTE — Progress Notes (Signed)
[facility] Cardiology [facility] Encounter Note  Patient: Rhonda Monroe / Admit Date: 11/21/2016 / Date of Encounter: 11/23/2016, 8:09 AM   Subjective: Patient feeling well today. No further episodes of chest discomfort. Patient hemodynamically stable. Troponin improving. Echocardiogram showing normal LV systolic function ejection fraction of 55% with normal valve structure and function Catheterization showing normal LV systolic function with ejection fraction of 55% and normal coronary arteries Review of Systems: Positive for: None Negative for: Vision change, hearing change, syncope, dizziness, nausea, vomiting,diarrhea, bloody stool, stomach pain, cough, congestion, diaphoresis, urinary frequency, urinary pain,skin lesions, skin rashes Others previously listed  Objective: Telemetry: Sinus rhythm Physical Exam: Blood pressure (!) 126/4, pulse 85, temperature 98.7 F (37.1 C), resp. rate 18, height 5\' 5"  (1.651 m), weight 75.7 kg (166 lb 12.8 oz), SpO2 99 %. Body mass index is 27.76 kg/m. General: Well developed, well nourished, in no acute distress. Head: Normocephalic, atraumatic, sclera non-icteric, no xanthomas, nares are without discharge. Neck: No apparent masses Lungs: Normal respirations with no wheezes, no rhonchi, no rales , no crackles   Heart: Regular rate and rhythm, normal S1 S2, no murmur, no rub, no gallop, PMI is normal size and placement, carotid upstroke normal without bruit, jugular venous pressure normal Abdomen: Soft, non-tender, non-distended with normoactive bowel sounds. No hepatosplenomegaly. Abdominal aorta is normal size without bruit Extremities: No edema, no clubbing, no cyanosis, no ulcers,  Peripheral: 2+ radial, 2+ femoral, 2+ dorsal pedal pulses Neuro: Alert and oriented. Moves all extremities spontaneously. Psych:  Responds to questions appropriately with a normal affect.   Intake/Output Summary (Last 24 hours) at 11/23/16 0809 Last data filed at  11/23/16 0500  Gross per 24 hour  Intake              240 ml  Output             1200 ml  Net             -960 ml    Inpatient Medications:  . [MAR Hold] aspirin EC  325 mg Oral Daily  . [MAR Hold] atorvastatin  40 mg Oral q1800  . [MAR Hold] enoxaparin (LOVENOX) injection  1 mg/kg Subcutaneous Q12H  . [MAR Hold] metoprolol tartrate  12.5 mg Oral BID  . [MAR Hold] sodium chloride flush  3 mL Intravenous Q12H  . [facility] Hold] sodium chloride flush  3 mL Intravenous Q12H  . sodium chloride flush  3 mL Intravenous Q12H   Infusions:  . sodium chloride      Labs:  Recent Labs  11/21/16 1219 11/22/16 0306  NA 139 138  K 3.8 3.7  CL 103 104  CO2 27 25  GLUCOSE 112* 100*  BUN 11 8  CREATININE 0.72 0.66  CALCIUM 9.1 8.6*    Recent Labs  11/21/16 1219  AST 34  ALT 31  ALKPHOS 48  BILITOT 1.2  PROT 7.4  ALBUMIN 4.3    Recent Labs  11/21/16 1219 11/22/16 0306  WBC 4.5 5.2  HGB 14.3 14.0  HCT 43.0 40.4  MCV 87.0 86.6  PLT 206 199    Recent Labs  11/21/16 2045 11/22/16 0306 11/22/16 0851 11/22/16 1459  TROPONINI 2.59* 5.30* 5.93* 4.43*   Invalid input(s): POCBNP No results for input(s): HGBA1C in the last 72 hours.   Weights: Filed Weights   11/21/16 1221 11/21/16 1938  Weight: 77.1 kg (170 lb) 75.7 kg (166 lb 12.8 oz)     Radiology/Studies:  Dg Chest 2 View  Result Date: 11/21/2016 CLINICAL DATA:  Pt c/o flu like symptoms and R sided flank pain that started on Sunday. Pt c/o nausea with the flank pain. EXAM: CHEST  2 VIEW COMPARISON:  None. FINDINGS: The heart size and mediastinal contours are within normal limits. Both lungs are clear. The visualized skeletal structures are unremarkable. IMPRESSION: No active cardiopulmonary disease. Electronically Signed   By: Kathreen Devoid   On: 11/21/2016 12:52   US Abdomen Limited Ruq  Result Date: 11/21/2016 CLINICAL DATA:  Abdominal pain EXAM: US ABDOMEN LIMITED - RIGHT UPPER QUADRANT COMPARISON:  None.  FINDINGS: Gallbladder: No gallstones or wall thickening visualized. No sonographic Murphy sign noted by sonographer. Common bile duct: Diameter: 2.5 mm Liver: No focal lesion identified. Within normal limits in parenchymal echogenicity. IMPRESSION: Normal study. Electronically Signed   By: Dorise Bullion III M.D   On: 11/21/2016 15:56     Assessment and Recommendation  56 y.o. female with no previous cardiovascular history or significant risk factors cardiovascular disease having episodes of chest discomfort shortness of breath and possible viral myocarditis with elevation of troponin with echocardiogram showing normal LV systolic function without evidence of valvular heart disease and catheterization showing normal coronary arteries 1. Continue aspirin for further risk reduction cardiovascular event 2. Low-dose metoprolol for possible non-ST elevation myocardial infarction secondary to viral myocarditis 3. Begin ambulation and follow for improvements and possible discharge to home. Patient has had a long discussion of the cardiac rehabilitation and other physical activity.  Signed, Serafina Royals M.D. FACC

## 2016-11-23 NOTE — Progress Notes (Addendum)
Reviewed the Telemetry strips with Dr. Darvin Neighbours - episodes of 3-4 beats of complete heart block

## 2016-11-23 NOTE — Progress Notes (Signed)
[facility] Cardiology [facility] Encounter Note  Patient: Rhonda Monroe / Admit Date: 11/21/2016 / Date of Encounter: 11/23/2016, 12:55 PM   Subjective: Patient feeling well today. No further episodes of chest discomfort. Patient hemodynamically stable. Troponin improving. Echocardiogram showing normal LV systolic function ejection fraction of 55% with normal valve structure and function Catheterization showing normal LV systolic function with ejection fraction of 55% and normal coronary arteries Review of tele strips suggests second degree type I block and episodes of complete heart block as well There has been no new sx of this  And may be secondary to meds  Review of Systems: Positive for: None Negative for: Vision change, hearing change, syncope, dizziness, nausea, vomiting,diarrhea, bloody stool, stomach pain, cough, congestion, diaphoresis, urinary frequency, urinary pain,skin lesions, skin rashes Others previously listed  Objective: Telemetry: Sinus rhythm with occasional variable heart block Physical Exam: Blood pressure 105/71, pulse 66, temperature 98.6 F (37 C), temperature source Oral, resp. rate 16, height 5\' 5"  (1.651 m), weight 75.7 kg (166 lb 12.8 oz), SpO2 98 %. Body mass index is 27.76 kg/m. General: Well developed, well nourished, in no acute distress. Head: Normocephalic, atraumatic, sclera non-icteric, no xanthomas, nares are without discharge. Neck: No apparent masses Lungs: Normal respirations with no wheezes, no rhonchi, no rales , no crackles   Heart: Regular rate and rhythm, normal S1 S2, no murmur, no rub, no gallop, PMI is normal size and placement, carotid upstroke normal without bruit, jugular venous pressure normal Abdomen: Soft, non-tender, non-distended with normoactive bowel sounds. No hepatosplenomegaly. Abdominal aorta is normal size without bruit Extremities: No edema, no clubbing, no cyanosis, no ulcers,  Peripheral: 2+ radial, 2+ femoral, 2+ dorsal  pedal pulses Neuro: Alert and oriented. Moves all extremities spontaneously. Psych:  Responds to questions appropriately with a normal affect.   Intake/Output Summary (Last 24 hours) at 11/23/16 1255 Last data filed at 11/23/16 0500  Gross per 24 hour  Intake              240 ml  Output             1200 ml  Net             -960 ml    Inpatient Medications:  . aspirin EC  325 mg Oral Daily  . atorvastatin  40 mg Oral q1800  . enoxaparin (LOVENOX) injection  1 mg/kg Subcutaneous Q12H  . sodium chloride flush  3 mL Intravenous Q12H   Infusions:  . sodium chloride 1 mL/kg/hr (11/23/16 0930)    Labs:  Recent Labs  11/21/16 1219 11/22/16 0306  NA 139 138  K 3.8 3.7  CL 103 104  CO2 27 25  GLUCOSE 112* 100*  BUN 11 8  CREATININE 0.72 0.66  CALCIUM 9.1 8.6*    Recent Labs  11/21/16 1219  AST 34  ALT 31  ALKPHOS 48  BILITOT 1.2  PROT 7.4  ALBUMIN 4.3    Recent Labs  11/21/16 1219 11/22/16 0306  WBC 4.5 5.2  HGB 14.3 14.0  HCT 43.0 40.4  MCV 87.0 86.6  PLT 206 199    Recent Labs  11/21/16 2045 11/22/16 0306 11/22/16 0851 11/22/16 1459  TROPONINI 2.59* 5.30* 5.93* 4.43*   Invalid input(s): POCBNP  Recent Labs  11/21/16 1628  HGBA1C 5.5     Weights: Filed Weights   11/21/16 1221 11/21/16 1938  Weight: 77.1 kg (170 lb) 75.7 kg (166 lb 12.8 oz)     Radiology/Studies:  Dg Chest  2 View  Result Date: 11/21/2016 CLINICAL DATA:  Pt c/o flu like symptoms and R sided flank pain that started on Sunday. Pt c/o nausea with the flank pain. EXAM: CHEST  2 VIEW COMPARISON:  None. FINDINGS: The heart size and mediastinal contours are within normal limits. Both lungs are clear. The visualized skeletal structures are unremarkable. IMPRESSION: No active cardiopulmonary disease. Electronically Signed   By: Kathreen Devoid   On: 11/21/2016 12:52   US Abdomen Limited Ruq  Result Date: 11/21/2016 CLINICAL DATA:  Abdominal pain EXAM: US ABDOMEN LIMITED - RIGHT  UPPER QUADRANT COMPARISON:  None. FINDINGS: Gallbladder: No gallstones or wall thickening visualized. No sonographic Murphy sign noted by sonographer. Common bile duct: Diameter: 2.5 mm Liver: No focal lesion identified. Within normal limits in parenchymal echogenicity. IMPRESSION: Normal study. Electronically Signed   By: Dorise Bullion III M.D   On: 11/21/2016 15:56     Assessment and Recommendation  56 y.o. female with no previous cardiovascular history or significant risk factors cardiovascular disease having episodes of chest discomfort shortness of breath and possible viral myocarditis with elevation of troponin with echocardiogram showing normal LV systolic function without evidence of valvular heart disease and catheterization showing normal coronary arteries 1. Continue aspirin for further risk reduction cardiovascular event 2.  Discontinue Low-dose metoprolol for possible advanved heart block 3. Begin ambulation and follow for improvements and possible . Patient has had a long discussion of the cardiac rehabilitation and other physical activity. 4. Follow this after noon for resolution of heart block Signed, Serafina Royals M.D. FACC

## 2016-11-23 NOTE — Plan of Care (Signed)
Problem: Cardiac: Goal: Ability to achieve and maintain adequate cardiopulmonary perfusion will improve Outcome: Progressing Chest pain free this shift.  Headache relieved with Tylenol earlier in shift.  SB/SR with short runs Type II heart block noted on telemetry.  Metoprolol dose held this shift.

## 2016-11-23 NOTE — Progress Notes (Signed)
[facility] [place] NAME: Keniya Gaetz    MR#:  [id number]  DATE OF BIRTH:  04-09-1961  SUBJECTIVE:  CHIEF COMPLAINT:   Chief Complaint  Patient presents with  . Flank Pain  . Chest Pain   No chest pain today   REVIEW OF SYSTEMS:    Review of Systems  Constitutional: Negative for chills and fever.  HENT: Negative for sore throat.   Eyes: Negative for blurred vision, double vision and pain.  Respiratory: Negative for cough, hemoptysis, shortness of breath and wheezing.   Cardiovascular: Negative for chest pain, palpitations, orthopnea and leg swelling.  Gastrointestinal: Positive for nausea. Negative for abdominal pain, constipation, diarrhea, heartburn and vomiting.  Genitourinary: Negative for dysuria and hematuria.  Musculoskeletal: Negative for back pain and joint pain.  Skin: Negative for rash.  Neurological: Negative for sensory change, speech change, focal weakness and headaches.  Endo/Heme/Allergies: Does not bruise/bleed easily.  Psychiatric/Behavioral: Negative for depression. The patient is not nervous/anxious.     DRUG ALLERGIES:  Not on File  VITALS:  Blood pressure 105/71, pulse 66, temperature 98.6 F (37 C), temperature source Oral, resp. rate 16, height 5\' 5"  (1.651 m), weight 75.7 kg (166 lb 12.8 oz), SpO2 98 %.  PHYSICAL EXAMINATION:   Physical Exam  GENERAL:  56 y.o.-year-old patient lying in the bed with no acute distress.  EYES: Pupils equal, round, reactive to light and accommodation. No scleral icterus. Extraocular muscles intact.  HEENT: Head atraumatic, normocephalic. Oropharynx and nasopharynx clear.  NECK:  Supple, no jugular venous distention. No thyroid enlargement, no tenderness.  LUNGS: Normal breath sounds bilaterally, no wheezing, rales, rhonchi. No use of accessory muscles of respiration.  CARDIOVASCULAR: S1, S2 normal. No murmurs, rubs, or gallops.  ABDOMEN: Soft, nontender, nondistended.  Bowel sounds present. No organomegaly or mass.  EXTREMITIES: No cyanosis, clubbing or edema b/l.    NEUROLOGIC: Cranial nerves II through XII are intact. No focal Motor or sensory deficits b/l.   PSYCHIATRIC: The patient is alert and oriented x 3.  SKIN: No obvious rash, lesion, or ulcer.   LABORATORY PANEL:   CBC  Recent Labs Lab 11/22/16 0306  WBC 5.2  HGB 14.0  HCT 40.4  PLT 199   ------------------------------------------------------------------------------------------------------------------ Chemistries   Recent Labs Lab 11/21/16 1219 11/22/16 0306  NA 139 138  K 3.8 3.7  CL 103 104  CO2 27 25  GLUCOSE 112* 100*  BUN 11 8  CREATININE 0.72 0.66  CALCIUM 9.1 8.6*  AST 34  --   ALT 31  --   ALKPHOS 48  --   BILITOT 1.2  --    ------------------------------------------------------------------------------------------------------------------  Cardiac Enzymes  Recent Labs Lab 11/22/16 1459  TROPONINI 4.43*   ------------------------------------------------------------------------------------------------------------------  RADIOLOGY:  US Abdomen Limited Ruq  Result Date: 11/21/2016 CLINICAL DATA:  Abdominal pain EXAM: US ABDOMEN LIMITED - RIGHT UPPER QUADRANT COMPARISON:  None. FINDINGS: Gallbladder: No gallstones or wall thickening visualized. No sonographic Murphy sign noted by sonographer. Common bile duct: Diameter: 2.5 mm Liver: No focal lesion identified. Within normal limits in parenchymal echogenicity. IMPRESSION: Normal study. Electronically Signed   By: Dorise Bullion III M.D   On: 11/21/2016 15:56   ASSESSMENT AND PLAN:   * Chest with elevated troponin Not MI. Likely viral myocarditis. Treated for dose anticoagulation, aspirin and statin along with beta blocker. Today cardiac catheterization showed normal coronaries.  * Type II AV block Hold metoprolol. Discussed with Dr. Nehemiah Massed of cardiology. Monitor  in telemetry. No pacemaker needed at this  time.  All the records are reviewed and case discussed with Care Management/Social Workerr. Management plans discussed with the patient, family and they are in agreement.  CODE STATUS: FULL CODE  DVT Prophylaxis: SCDs  TOTAL TIME TAKING CARE OF THIS PATIENT: 30 minutes.   POSSIBLE D/C IN 1-2 DAYS, DEPENDING ON CLINICAL CONDITION.  Hillary Bow R M.D on 11/23/2016 at 2:28 PM  Between 7am to 6pm - Pager - [phone]  After 6pm go to [email or web address] - password [email or web address] [facility]  Office  [phone]  CC: Primary care physician; Rusty Aus, MD  Note: This dictation was prepared with Dragon dictation along with smaller phrase technology. Any transcriptional errors that result from this process are unintentional.

## 2016-11-23 NOTE — Progress Notes (Signed)
Another run of complete heart block

## 2016-11-23 NOTE — Discharge Instructions (Signed)
Heart healthy diet. °Activity as tolerated. °

## 2016-11-24 LAB — HIV ANTIBODY (ROUTINE TESTING W REFLEX): HIV SCREEN 4TH GENERATION: NONREACTIVE

## 2016-11-24 MED ORDER — ENOXAPARIN SODIUM 40 MG/0.4ML ~~LOC~~ SOLN: 40.0000 mg | SUBCUTANEOUS | Status: DC

## 2016-11-24 NOTE — Progress Notes (Signed)
[facility] Cardiology [facility] Encounter Note  Patient: Rhonda Monroe / Admit Date: 11/21/2016 / Date of Encounter: 11/24/2016, 4:18 PM   Subjective: Patient feeling well today. No further episodes of chest discomfort. Patient hemodynamically stable. Troponin improving. Echocardiogram showing normal LV systolic function ejection fraction of 55% with normal valve structure and function Catheterization showing normal LV systolic function with ejection fraction of 55% and normal coronary arteries Review of tele strips suggests second degree type I block and episodes of complete heart block as well asymptomatic and possibly related to above and or vagal tone and betablocker There has been no new sx  today  Review of Systems: Positive for: None Negative for: Vision change, hearing change, syncope, dizziness, nausea, vomiting,diarrhea, bloody stool, stomach pain, cough, congestion, diaphoresis, urinary frequency, urinary pain,skin lesions, skin rashes Others previously listed  Objective: Telemetry: Sinus rhythm with occasional variable heart block Physical Exam: Blood pressure 114/73, pulse 77, temperature 99 F (37.2 C), temperature source Oral, resp. rate 16, height 5\' 5"  (1.651 m), weight 75.7 kg (166 lb 12.8 oz), SpO2 95 %. Body mass index is 27.76 kg/m. General: Well developed, well nourished, in no acute distress. Head: Normocephalic, atraumatic, sclera non-icteric, no xanthomas, nares are without discharge. Neck: No apparent masses Lungs: Normal respirations with no wheezes, no rhonchi, no rales , no crackles   Heart: Regular rate and rhythm, normal S1 S2, no murmur, no rub, no gallop, PMI is normal size and placement, carotid upstroke normal without bruit, jugular venous pressure normal Abdomen: Soft, non-tender, non-distended with normoactive bowel sounds. No hepatosplenomegaly. Abdominal aorta is normal size without bruit Extremities: No edema, no clubbing, no cyanosis, no ulcers,   Peripheral: 2+ radial, 2+ femoral, 2+ dorsal pedal pulses Neuro: Alert and oriented. Moves all extremities spontaneously. Psych:  Responds to questions appropriately with a normal affect.   Intake/Output Summary (Last 24 hours) at 11/24/16 1618 Last data filed at 11/24/16 0805  Gross per 24 hour  Intake          1086.58 ml  Output             2000 ml  Net          -913.42 ml    Inpatient Medications:  . aspirin EC  325 mg Oral Daily  . atorvastatin  40 mg Oral q1800  . enoxaparin (LOVENOX) injection  40 mg Subcutaneous Q24H  . sodium chloride flush  3 mL Intravenous Q12H   Infusions:    Labs:  Recent Labs  11/22/16 0306  NA 138  K 3.7  CL 104  CO2 25  GLUCOSE 100*  BUN 8  CREATININE 0.66  CALCIUM 8.6*   No results for input(s): AST, ALT, ALKPHOS, BILITOT, PROT, ALBUMIN in the last 72 hours.  Recent Labs  11/22/16 0306  WBC 5.2  HGB 14.0  HCT 40.4  MCV 86.6  PLT 199    Recent Labs  11/21/16 2045 11/22/16 0306 11/22/16 0851 11/22/16 1459  TROPONINI 2.59* 5.30* 5.93* 4.43*   Invalid input(s): POCBNP  Recent Labs  11/21/16 1628  HGBA1C 5.5     Weights: Filed Weights   11/21/16 1221 11/21/16 1938  Weight: 77.1 kg (170 lb) 75.7 kg (166 lb 12.8 oz)     Radiology/Studies:  Dg Chest 2 View  Result Date: 11/21/2016 CLINICAL DATA:  Pt c/o flu like symptoms and R sided flank pain that started on Sunday. Pt c/o nausea with the flank pain. EXAM: CHEST  2 VIEW COMPARISON:  None.  FINDINGS: The heart size and mediastinal contours are within normal limits. Both lungs are clear. The visualized skeletal structures are unremarkable. IMPRESSION: No active cardiopulmonary disease. Electronically Signed   By: Kathreen Devoid   On: 11/21/2016 12:52   US Abdomen Limited Ruq  Result Date: 11/21/2016 CLINICAL DATA:  Abdominal pain EXAM: US ABDOMEN LIMITED - RIGHT UPPER QUADRANT COMPARISON:  None. FINDINGS: Gallbladder: No gallstones or wall thickening visualized. No  sonographic Murphy sign noted by sonographer. Common bile duct: Diameter: 2.5 mm Liver: No focal lesion identified. Within normal limits in parenchymal echogenicity. IMPRESSION: Normal study. Electronically Signed   By: Dorise Bullion III M.D   On: 11/21/2016 15:56     Assessment and Recommendation  56 y.o. female with no previous cardiovascular history or significant risk factors cardiovascular disease having episodes of chest discomfort shortness of breath and possible viral myocarditis with elevation of troponin with echocardiogram showing normal LV systolic function without evidence of valvular heart disease and catheterization showing normal coronary arteries Variable heart block without sx despite dc of beta blocker which may be due to reversable causes including viral myocarditis, meds, lyme disease, or vagal tone (discussed plan and issues with pateint, family and other cardiology staff with agreement) 1. Continue aspirin for further risk reduction cardiovascular event 2.  No use of Low-dose metoprolol for possible advanved heart block 3. Begin ambulation and follow for improvements and possible . Patient has had a long discussion of the cardiac rehabilitation and other physical activity. 4. Lyme titer to assess for other causes of heart block 5.ok for dc to home if no new sx and fu in office tomorrow for placement of holter Signed, Serafina Royals M.D. FACC

## 2016-11-24 NOTE — Progress Notes (Signed)
IVs and tele removed from patient. Discharge instructions along with hard copy prescription given to patient as well. Patient verbalized understanding. No acute distress at this time. Family member at bedside and will be transporting patient home.

## 2016-11-24 NOTE — Progress Notes (Signed)
[facility] [place] NAME: Rhonda Monroe    MR#:  [id number]  DATE OF BIRTH:  Aug 29, 1961  SUBJECTIVE:  CHIEF COMPLAINT:   Chief Complaint  Patient presents with  . Flank Pain  . Chest Pain   No chest pain today.   REVIEW OF SYSTEMS:    Review of Systems  Constitutional: Negative for chills and fever.  HENT: Negative for sore throat.   Eyes: Negative for blurred vision, double vision and pain.  Respiratory: Negative for cough, hemoptysis, shortness of breath and wheezing.   Cardiovascular: Negative for chest pain, palpitations, orthopnea and leg swelling.  Gastrointestinal: Positive for nausea. Negative for abdominal pain, constipation, diarrhea, heartburn and vomiting.  Genitourinary: Negative for dysuria and hematuria.  Musculoskeletal: Negative for back pain and joint pain.  Skin: Negative for rash.  Neurological: Negative for sensory change, speech change, focal weakness and headaches.  Endo/Heme/Allergies: Does not bruise/bleed easily.  Psychiatric/Behavioral: Negative for depression. The patient is not nervous/anxious.     DRUG ALLERGIES:  Not on File  VITALS:  Blood pressure 114/73, pulse 77, temperature 99 F (37.2 C), temperature source Oral, resp. rate 16, height 5\' 5"  (1.651 m), weight 75.7 kg (166 lb 12.8 oz), SpO2 95 %.  PHYSICAL EXAMINATION:   Physical Exam  GENERAL:  56 y.o.-year-old patient lying in the bed with no acute distress.  EYES: Pupils equal, round, reactive to light and accommodation. No scleral icterus. Extraocular muscles intact.  HEENT: Head atraumatic, normocephalic. Oropharynx and nasopharynx clear.  NECK:  Supple, no jugular venous distention. No thyroid enlargement, no tenderness.  LUNGS: Normal breath sounds bilaterally, no wheezing, rales, rhonchi. No use of accessory muscles of respiration.  CARDIOVASCULAR: S1, S2 normal. No murmurs, rubs, or gallops.  ABDOMEN: Soft, nontender, nondistended.  Bowel sounds present. No organomegaly or mass.  EXTREMITIES: No cyanosis, clubbing or edema b/l.    NEUROLOGIC: Cranial nerves II through XII are intact. No focal Motor or sensory deficits b/l.   PSYCHIATRIC: The patient is alert and oriented x 3.  SKIN: No obvious rash, lesion, or ulcer.   LABORATORY PANEL:   CBC  Recent Labs Lab 11/22/16 0306  WBC 5.2  HGB 14.0  HCT 40.4  PLT 199   ------------------------------------------------------------------------------------------------------------------ Chemistries   Recent Labs Lab 11/21/16 1219 11/22/16 0306  NA 139 138  K 3.8 3.7  CL 103 104  CO2 27 25  GLUCOSE 112* 100*  BUN 11 8  CREATININE 0.72 0.66  CALCIUM 9.1 8.6*  AST 34  --   ALT 31  --   ALKPHOS 48  --   BILITOT 1.2  --    ------------------------------------------------------------------------------------------------------------------  Cardiac Enzymes  Recent Labs Lab 11/22/16 1459  TROPONINI 4.43*   ------------------------------------------------------------------------------------------------------------------  RADIOLOGY:  No results found. ASSESSMENT AND PLAN:   * Chest with elevated troponin Not MI. Likely viral myocarditis. Treated for dose anticoagulation, aspirin and statin along with beta blocker. cardiac catheterization showed normal coronaries.  * Type II AV block, asymptomatic  metoprolol stopped yesterday. Discussed with Dr. Nehemiah Massed of cardiology. Monitor in telemetry. He will discuss with EP regarding further plan if patient needs PPM.  All the records are reviewed and case discussed with Care Management/Social Workerr. Management plans discussed with the patient, family and they are in agreement.  CODE STATUS: FULL CODE  DVT Prophylaxis: SCDs  TOTAL TIME TAKING CARE OF THIS PATIENT: 30 minutes.   POSSIBLE D/C IN 1-2 DAYS, DEPENDING ON CLINICAL CONDITION.  Rhonda Monroe,  Leia Alf M.D on 11/24/2016 at 3:23 PM  Between 7am to 6pm  - Pager - [phone]  After 6pm go to [email or web address] - password [email or web address] [facility]  Office  [phone]  CC: Primary care physician; Rusty Aus, MD  Note: This dictation was prepared with Dragon dictation along with smaller phrase technology. Any transcriptional errors that result from this process are unintentional.

## 2016-11-25 LAB — B. BURGDORFI ANTIBODIES

## 2016-11-27 NOTE — Discharge Summary (Signed)
[facility] [place] NAME: Rhonda Monroe    MR#:  [id number]  DATE OF BIRTH:  [birth year]  DATE OF ADMISSION:  11/21/2016 ADMITTING PHYSICIAN: Dustin Flock, MD  DATE OF DISCHARGE: 11/24/2016  5:36 PM  PRIMARY CARE PHYSICIAN: Rusty Aus, MD   ADMISSION DIAGNOSIS:  Abdominal pain [R10.9] Chest pain, unspecified type [R07.9]  DISCHARGE DIAGNOSIS:  Active Problems:   Chest pain   NSTEMI (non-ST elevated myocardial infarction) ([place])   SECONDARY DIAGNOSIS:  History reviewed. No pertinent past medical history.   ADMITTING HISTORY  HISTORY OF PRESENT ILLNESS: Rhonda Monroe  is a 56 y.o. female with No medical history according to her and not on any medications who states that she had flulike illness since last Sunday. Which included stuffy nose and congestion. As well as fevers. She also states that she had pain in her kidneys. Her fevers have resolved after 2-3 days. However subsequently she became nauseous. She did not have any vomiting. Then she started having again flank pain and then started having chest pressure today. Therefore she came to the ED. Now the chest pressure is resolved. Her troponin was 0 and then subsequently elevated  .22. Therefore we are asked to admit the patient. She states that she she has had substernal chest pressure occasionally but is very rare. She is pretty active and does not get these symptoms with exertion. PAST MEDICAL HISTORY:  No past medical history on file.  HOSPITAL COURSE:   * Chest with elevated troponin Not MI. Likely viral myocarditis. Treated for dose anticoagulation, aspirin and statin along with beta blocker. cardiac catheterization showed normal coronaries.  * Type II AV block, asymptomatic  metoprolol stopped . Discussed with Dr. Nehemiah Massed of cardiology. No pacemaker at this time Lyme titer ordered by cardiology and pending. F/u tomorrow at Dr. Alveria Apley office for holter  Stable for  discharge home  CONSULTS OBTAINED:  Treatment Team:  Corey Skains, MD  DRUG ALLERGIES:  Not on File  DISCHARGE MEDICATIONS:   Discharge Medication List as of 11/24/2016  4:56 PM    START taking these medications   Details  aspirin EC 325 MG EC tablet Take 1 tablet (325 mg total) by mouth daily., Starting Tue 11/24/2016, Print    atorvastatin (LIPITOR) 40 MG tablet Take 0.5 tablets (20 mg total) by mouth daily at 6 PM., Starting Mon 11/23/2016, Normal      STOP taking these medications     metoprolol tartrate (LOPRESSOR) 25 MG tablet         Today   VITAL SIGNS:  Blood pressure 114/73, pulse 77, temperature 99 F (37.2 C), temperature source Oral, resp. rate 16, height 5\' 5"  (1.651 m), weight 75.7 kg (166 lb 12.8 oz), SpO2 95 %.  I/O:  No intake or output data in the 24 hours ending 11/27/16 1356  PHYSICAL EXAMINATION:  Physical Exam  GENERAL:  56 y.o.-year-old patient lying in the bed with no acute distress.  LUNGS: Normal breath sounds bilaterally, no wheezing, rales,rhonchi or crepitation. No use of accessory muscles of respiration.  CARDIOVASCULAR: S1, S2 normal. No murmurs, rubs, or gallops.  ABDOMEN: Soft, non-tender, non-distended. Bowel sounds present. No organomegaly or mass.  NEUROLOGIC: Moves all 4 extremities. PSYCHIATRIC: The patient is alert and oriented x 3.  SKIN: No obvious rash, lesion, or ulcer.   DATA REVIEW:   CBC  Recent Labs Lab 11/22/16 0306  WBC 5.2  HGB 14.0  HCT 40.4  PLT  [facility] 11/21/16 1219 11/22/16 0306  NA 139 138  K 3.8 3.7  CL 103 104  CO2 27 25  GLUCOSE 112* 100*  BUN 11 8  CREATININE 0.72 0.66  CALCIUM 9.1 8.6*  AST 34  --   ALT 31  --   ALKPHOS 48  --   BILITOT 1.2  --     Cardiac Enzymes  Recent Labs Lab 11/22/16 1459  TROPONINI 4.43*    Microbiology Results  Results for orders placed or performed during the hospital encounter of 11/21/16  Urine culture      Status: Abnormal   Collection Time: 11/21/16 12:19 PM  Result Value Ref Range Status   Specimen Description URINE, RANDOM  Final   Special Requests NONE  Final   Culture (A)  Final    <10,000 COLONIES/mL INSIGNIFICANT GROWTH Performed at [facility], [address], [place],  [address]    Report Status 11/23/2016 FINAL  Final    RADIOLOGY:  No results found.  Follow up with PCP in 1 week.  Management plans discussed with the patient, family and they are in agreement.  CODE STATUS:  Code Status History    Date Active Date Inactive Code Status Order ID Comments User Context   11/21/2016  7:39 PM 11/24/2016  9:13 PM Full Code [id number]  Dustin Flock, MD Inpatient      TOTAL TIME TAKING CARE OF THIS PATIENT ON DAY OF DISCHARGE: more than 30 minutes.   Hillary Bow R M.D on 11/27/2016 at 1:56 PM  Between 7am to 6pm - Pager - [phone]  After 6pm go to [email or web address] - password [email or web address] [facility]  Office  [phone]  CC: Primary care physician; Rusty Aus, MD  Note: This dictation was prepared with Dragon dictation along with smaller phrase technology. Any transcriptional errors that result from this process are unintentional.

## 2016-11-30 ENCOUNTER — Other Ambulatory Visit: Payer: Self-pay | Admitting: Internal Medicine

## 2016-11-30 DIAGNOSIS — R1084 Generalized abdominal pain: Secondary | ICD-10-CM

## 2016-12-03 ENCOUNTER — Ambulatory Visit
Admission: RE | Admit: 2016-12-03 | Discharge: 2016-12-03 | Disposition: A | Discharge status: Home / Self Care | Payer: BLUE CROSS/BLUE SHIELD | Source: Ambulatory Visit | Attending: Internal Medicine | Admitting: Internal Medicine

## 2017-01-18 DIAGNOSIS — E782 Mixed hyperlipidemia: Secondary | ICD-10-CM | POA: Insufficient documentation

## 2017-05-31 DIAGNOSIS — E538 Deficiency of other specified B group vitamins: Secondary | ICD-10-CM | POA: Insufficient documentation

## 2020-06-25 ENCOUNTER — Encounter: Payer: Self-pay | Admitting: Obstetrics and Gynecology

## 2020-06-25 ENCOUNTER — Other Ambulatory Visit: Payer: Self-pay

## 2020-06-25 ENCOUNTER — Other Ambulatory Visit (HOSPITAL_COMMUNITY)
Admission: RE | Admit: 2020-06-25 | Discharge: 2020-06-25 | Disposition: A | Discharge status: Home / Self Care | Payer: 59 | Source: Ambulatory Visit | Attending: Obstetrics and Gynecology | Admitting: Obstetrics and Gynecology

## 2020-06-25 ENCOUNTER — Ambulatory Visit (INDEPENDENT_AMBULATORY_CARE_PROVIDER_SITE_OTHER): Payer: 59 | Admitting: Obstetrics and Gynecology

## 2020-06-25 VITALS — BP 114/72 | HR 84 | Resp 18 | Ht 65.0 in | Wt 164.6 lb

## 2020-06-25 DIAGNOSIS — Z01419 Encounter for gynecological examination (general) (routine) without abnormal findings: Secondary | ICD-10-CM | POA: Diagnosis not present

## 2020-06-25 DIAGNOSIS — Z1283 Encounter for screening for malignant neoplasm of skin: Secondary | ICD-10-CM

## 2020-06-25 DIAGNOSIS — Z124 Encounter for screening for malignant neoplasm of cervix: Secondary | ICD-10-CM | POA: Diagnosis present

## 2020-06-25 DIAGNOSIS — Z1231 Encounter for screening mammogram for malignant neoplasm of breast: Secondary | ICD-10-CM

## 2020-06-25 DIAGNOSIS — R300 Dysuria: Secondary | ICD-10-CM

## 2020-06-25 DIAGNOSIS — Z0000 Encounter for general adult medical examination without abnormal findings: Secondary | ICD-10-CM | POA: Diagnosis not present

## 2020-06-25 DIAGNOSIS — Z1211 Encounter for screening for malignant neoplasm of colon: Secondary | ICD-10-CM

## 2020-06-25 DIAGNOSIS — Z13820 Encounter for screening for osteoporosis: Secondary | ICD-10-CM

## 2020-06-25 NOTE — Progress Notes (Signed)
Gynecology Annual Exam  PCP: Rusty Aus, MD  Chief Complaint:  Chief Complaint  Patient presents with   Gynecologic Exam    annual exam     History of Present Illness: Patient is a 59 y.o. No obstetric history on file. presents for annual exam. The patient has no complaints today.   LMP: No LMP recorded. Patient is postmenopausal. Denies postmenopausal bleeding  The patient is sexually active. She currently uses none for contraception. She denies dyspareunia.  The patient does perform self breast exams.  There is no notable family history of breast or ovarian cancer in her family.  The patient denies current symptoms of depression.    Review of Systems: Review of Systems  Constitutional: Negative for chills, fever, malaise/fatigue and weight loss.  HENT: Negative for congestion, hearing loss and sinus pain.   Eyes: Negative for blurred vision and double vision.  Respiratory: Negative for cough, sputum production, shortness of breath and wheezing.   Cardiovascular: Negative for chest pain, palpitations, orthopnea and leg swelling.  Gastrointestinal: Negative for abdominal pain, constipation, diarrhea, nausea and vomiting.  Genitourinary: Negative for dysuria, flank pain, frequency, hematuria and urgency.  Musculoskeletal: Negative for back pain, falls and joint pain.  Skin: Negative for itching and rash.  Neurological: Negative for dizziness and headaches.  Psychiatric/Behavioral: Negative for depression, substance abuse and suicidal ideas. The patient is not nervous/anxious.     Past Medical History:  Patient Active Problem List   Diagnosis Date Noted   NSTEMI (non-ST elevated myocardial infarction) ([place]) 11/22/2016   Chest pain 11/21/2016    Past Surgical History:  Past Surgical History:  Procedure Laterality Date   CESAREAN SECTION     LEFT HEART CATH N/A 11/23/2016   Procedure: Left Heart Cath and Coronary Angiography;  Surgeon: Corey Skains, MD;   Location: [facility];  Service: Cardiovascular;  Laterality: N/A;   TONSILLECTOMY      Gynecologic History:  No LMP recorded. Patient is postmenopausal. Contraception: none Last Pap: Results were: more than 7-9 years ago Last mammogram: more than 7-9 years ago   Obstetric History: No obstetric history on file.  Family History:  Family History  Problem Relation Age of Onset   Dementia Mother     Social History:  Social History   Socioeconomic History   Marital status: Married    Spouse name: Not on file   Number of children: Not on file   Years of education: Not on file   Highest education level: Not on file  Occupational History   Not on file  Tobacco Use   Smoking status: Never Smoker   Smokeless tobacco: Never Used  Substance and Sexual Activity   Alcohol use: No   Drug use: No   Sexual activity: Not on file  Other Topics Concern   Not on file  Social History Narrative   Not on file   Social Determinants of Health   Financial Resource Strain:    Difficulty of Paying Living Expenses: Not on file  Food Insecurity:    Worried About [place] in the Last Year: Not on file   Ran Out of Food in the Last Year: Not on file  Transportation Needs:    Lack of Transportation (Medical): Not on file   Lack of Transportation (Non-Medical): Not on file  Physical Activity:    Days of Exercise per Week: Not on file   Minutes of Exercise per Session: Not on file  Stress:    Feeling of Stress : Not on file  Social Connections:    Frequency of Communication with Friends and Family: Not on file   Frequency of Social Gatherings with Friends and Family: Not on file   Attends Religious Services: Not on file   Active Member of Clubs or Organizations: Not on file   Attends Archivist Meetings: Not on file   Marital Status: Not on file  Intimate Partner Violence:    Fear of Current or Ex-Partner: Not on file    Emotionally Abused: Not on file   Physically Abused: Not on file   Sexually Abused: Not on file    Allergies:  Allergies  Allergen Reactions   Iodinated Diagnostic Agents Other (See Comments)    Bradycardia, Heart block.     Medications: Prior to Admission medications   Not on File    Physical Exam Vitals: Blood pressure 114/72, pulse 84, resp. rate 18, height 5\' 5"  (1.651 m), weight 164 lb 9.6 oz (74.7 kg), SpO2 98 %.  General: NAD HEENT: normocephalic, anicteric Thyroid: no enlargement, no palpable nodules Pulmonary: No increased work of breathing, CTAB Cardiovascular: RRR, distal pulses 2+ Breast: Breast symmetrical, no tenderness, no palpable nodules or masses, no skin or nipple retraction present, no nipple discharge.  No axillary or supraclavicular lymphadenopathy. Abdomen: NABS, soft, non-tender, non-distended.  Umbilicus without lesions.  No hepatomegaly, splenomegaly or masses palpable. No evidence of hernia  Genitourinary:  External: Normal external female genitalia.  Normal urethral meatus, normal Bartholin's and Skene's glands.    Vagina: Normal vaginal mucosa, no evidence of prolapse.    Cervix: Grossly normal in appearance, no bleeding  Uterus: Non-enlarged, mobile, normal contour.  No CMT  Adnexa: ovaries non-enlarged, no adnexal masses  Rectal: deferred  Lymphatic: no evidence of inguinal lymphadenopathy Extremities: no edema, erythema, or tenderness Neurologic: Grossly intact Psychiatric: mood appropriate, affect full  Female chaperone present for pelvic and breast  portions of the physical exam    Assessment: 59 y.o. No obstetric history on file. routine annual exam  Plan: Problem List Items Addressed This Visit    None    Visit Diagnoses    Encounter for annual routine gynecological examination    -  Primary   Health maintenance examination       Breast cancer screening by mammogram       Relevant Orders   MM 3D SCREEN BREAST BILATERAL    Screening for osteoporosis       Cervical cancer screening       Relevant Orders   Cytology, thin prep pap (cervical)   Encounter for gynecological examination without abnormal finding       Colon cancer screening       Relevant Orders   Ambulatory referral to Gastroenterology   Dysuria       Relevant Orders   Urinalysis with microscopic      1) Mammogram - recommend yearly screening mammogram.  Mammogram Was ordered today  2) ASCCP guidelines and rational discussed.  Patient opts for every 5 years screening interval  3) Colonoscopy -referral placed- Screening recommended starting at age 49 for average risk individuals, age 72 for individuals deemed at increased risk (including African Americans) and recommended to continue until age 64.  For patient age 109-85 individualized approach is recommended.  Gold standard screening is via colonoscopy, Cologuard screening is an acceptable alternative for patient unwilling or unable to undergo colonoscopy.  "Colorectal cancer screening for average?risk adults: 2018 guideline  update from the American Cancer Society"[place]: A Cancer Journal for Clinicians: Mar 10, 2017   4) Routine healthcare maintenance including cholesterol, diabetes screening discussed managed by PCP  5) Osteoporosis screening: initiate at 61 yo with DEXA scan.     6) Return in about 1 year (around 06/25/2021) for annual.   [place], [facility] 06/25/2020, 2:19 PM

## 2020-06-25 NOTE — Patient Instructions (Signed)
Institute of Medicine Recommended Dietary Allowances for Calcium and Vitamin D  Age (yr) Calcium Recommended Dietary Allowance (mg/day) Vitamin D Recommended Dietary Allowance (international units/day)  9-18 1,300 600  19-50 1,000 600  51-70 1,200 600  71 and older 1,200 800  Data from [employer] of [employer]. Dietary reference intakes: calcium, vitamin D. [place], [place]: National Academies Press; 2011.     Exercising to Stay Healthy To become healthy and stay healthy, it is recommended that you do moderate-intensity and vigorous-intensity exercise. You can tell that you are exercising at a moderate intensity if your heart starts beating faster and you start breathing faster but can still hold a conversation. You can tell that you are exercising at a vigorous intensity if you are breathing much harder and faster and cannot hold a conversation while exercising. Exercising regularly is important. It has many health benefits, such as:  Improving overall fitness, flexibility, and endurance.  Increasing bone density.  Helping with weight control.  Decreasing body fat.  Increasing muscle strength.  Reducing stress and tension.  Improving overall health. How often should I exercise? Choose an activity that you enjoy, and set realistic goals. Your health care provider can help you make an activity plan that works for you. Exercise regularly as told by your health care provider. This may include:  Doing strength training two times a week, such as: ? Lifting weights. ? Using resistance bands. ? Push-ups. ? Sit-ups. ? Yoga.  Doing a certain intensity of exercise for a given amount of time. Choose from these options: ? A total of 150 minutes of moderate-intensity exercise every week. ? A total of 75 minutes of vigorous-intensity exercise every week. ? A mix of moderate-intensity and vigorous-intensity exercise every week. Children, pregnant women, people who have not exercised  regularly, people who are overweight, and older adults may need to talk with a health care provider about what activities are safe to do. If you have a medical condition, be sure to talk with your health care provider before you start a new exercise program. What are some exercise ideas? Moderate-intensity exercise ideas include:  Walking 1 mile (1.6 km) in about 15 minutes.  Biking.  Hiking.  Golfing.  Dancing.  Water aerobics. Vigorous-intensity exercise ideas include:  Walking 4.5 miles (7.2 km) or more in about 1 hour.  Jogging or running 5 miles (8 km) in about 1 hour.  Biking 10 miles (16.1 km) or more in about 1 hour.  Lap swimming.  Roller-skating or in-line skating.  Cross-country skiing.  Vigorous competitive sports, such as football, basketball, and soccer.  Jumping rope.  Aerobic dancing. What are some everyday activities that can help me to get exercise?  Yard work, such as: ? Pushing a lawn mower. ? Raking and bagging leaves.  Washing your car.  Pushing a stroller.  Shoveling snow.  Gardening.  Washing windows or floors. How can I be more active in my day-to-day activities?  Use stairs instead of an elevator.  Take a walk during your lunch break.  If you drive, park your car farther away from your work or school.  If you take public transportation, get off one stop early and walk the rest of the way.  Stand up or walk around during all of your indoor phone calls.  Get up, stretch, and walk around every 30 minutes throughout the day.  Enjoy exercise with a friend. Support to continue exercising will help you keep a regular routine of activity. What guidelines can   I follow while exercising?  Before you start a new exercise program, talk with your health care provider.  Do not exercise so much that you hurt yourself, feel dizzy, or get very short of breath.  Wear comfortable clothes and wear shoes with good support.  Drink plenty of  water while you exercise to prevent dehydration or heat stroke.  Work out until your breathing and your heartbeat get faster. Where to find more information  [place] Department of Health and Human Services: www.hhs.gov  Centers for Disease Control and Prevention (CDC): www.cdc.gov Summary  Exercising regularly is important. It will improve your overall fitness, flexibility, and endurance.  Regular exercise also will improve your overall health. It can help you control your weight, reduce stress, and improve your bone density.  Do not exercise so much that you hurt yourself, feel dizzy, or get very short of breath.  Before you start a new exercise program, talk with your health care provider. This information is not intended to replace advice given to you by your health care provider. Make sure you discuss any questions you have with your health care provider. Document Revised: 09/10/2017 Document Reviewed: 08/19/2017 Elsevier Patient Education  2020 Elsevier Inc.   Budget-Friendly Healthy Eating There are many ways to save money at the grocery store and continue to eat healthy. You can be successful if you:  Plan meals according to your budget.  Make a grocery list and only purchase food according to your grocery list.  Prepare food yourself. What are tips for following this plan?  Reading food labels  Compare food labels between brand name foods and the store brand. Often the nutritional value is the same, but the store brand is lower cost.  Look for products that do not have added sugar, fat, or salt (sodium). These often cost the same but are healthier for you. Products may be labeled as: ? Sugar-free. ? Nonfat. ? Low-fat. ? Sodium-free. ? Low-sodium.  Look for lean ground beef labeled as at least 92% lean and 8% fat. Shopping  Buy only the items on your grocery list and go only to the areas of the store that have the items on your list.  Use coupons only for foods  and brands you normally buy. Avoid buying items you wouldn't normally buy simply because they are on sale.  Check online and in newspapers for weekly deals.  Buy healthy items from the bulk bins when available, such as herbs, spices, flour, pasta, nuts, and dried fruit.  Buy fruits and vegetables that are in season. Prices are usually lower on in-season produce.  Look at the unit price on the price tag. Use it to compare different brands and sizes to find out which item is the best deal.  Choose healthy items that are often low-cost, such as carrots, potatoes, apples, bananas, and oranges. Dried or canned beans are a low-cost protein source.  Buy in bulk and freeze extra food. Items you can buy in bulk include meats, fish, poultry, frozen fruits, and frozen vegetables.  Avoid buying "ready-to-eat" foods, such as pre-cut fruits and vegetables and pre-made salads.  If possible, shop around to discover where you can find the best prices. Consider other retailers such as dollar stores, larger wholesale stores, local fruit and vegetable stands, and farmers markets.  Do not shop when you are hungry. If you shop while hungry, it may be hard to stick to your list and budget.  Resist impulse buying. Use your grocery list as   your official plan for the week.  Buy a variety of vegetables and fruits by purchasing fresh, frozen, and canned items.  Look at the top and bottom shelves for deals. Foods at eye level (eye level of an adult or child) are usually more expensive.  Be efficient with your time when shopping. The more time you spend at the store, the more money you are likely to spend.  To save money when choosing more expensive foods like meats and dairy: ? Choose cheaper cuts of meat, such as bone-in chicken thighs and drumsticks instead of skinless and boneless chicken. When you are ready to prepare the chicken, you can remove the skin yourself to make it healthier. ? Choose lean meats like  chicken or turkey instead of beef. ? Choose canned seafood, such as tuna, salmon, or sardines. ? Buy eggs as a low-cost source of protein. ? Buy dried beans and peas, such as lentils, split peas, or kidney beans instead of meats. Dried beans and peas are a good alternative source of protein. ? Buy the larger tubs of yogurt instead of individual-sized containers.  Choose water instead of sodas and other sweetened beverages.  Avoid buying chips, cookies, and other "junk food." These items are usually expensive and not healthy. Cooking  Make extra food and freeze the extras in meal-sized containers or in individual portions for fast meals and snacks.  Pre-cook on days when you have extra time to prepare meals in advance. You can keep these meals in the fridge or freezer and reheat for a quick meal.  When you come home from the grocery store, wash, peel, and cut fruits and vegetables so they are ready to use and eat. This will help reduce food waste. Meal planning  Do not eat out or get fast food. Prepare food at home.  Make a grocery list and make sure to bring it with you to the store. If you have a smart phone, you could use your phone to create your shopping list.  Plan meals and snacks according to a grocery list and budget you create.  Use leftovers in your meal plan for the week.  Look for recipes where you can cook once and make enough food for two meals.  Include budget-friendly meals like stews, casseroles, and stir-fry dishes.  Try some meatless meals or try "no cook" meals like salads.  Make sure that half your plate is filled with fruits or vegetables. Choose from fresh, frozen, or canned fruits and vegetables. If eating canned, remember to rinse them before eating. This will remove any excess salt added for packaging. Summary  Eating healthy on a budget is possible if you plan your meals according to your budget, purchase according to your budget and grocery list, and  prepare food yourself.  Tips for buying more food on a limited budget include buying generic brands, using coupons only for foods you normally buy, and buying healthy items from the bulk bins when available.  Tips for buying cheaper food to replace expensive food include choosing cheaper, lean cuts of meat, and buying dried beans and peas. This information is not intended to replace advice given to you by your health care provider. Make sure you discuss any questions you have with your health care provider. Document Revised: 09/29/2017 Document Reviewed: 09/29/2017 Elsevier Patient Education  2020 Elsevier Inc.   Bone Health Bones protect organs, store calcium, anchor muscles, and support the whole body. Keeping your bones strong is important, especially as you   get older. You can take actions to help keep your bones strong and healthy. Why is keeping my bones healthy important?  Keeping your bones healthy is important because your body constantly replaces bone cells. Cells get old, and new cells take their place. As we age, we lose bone cells because the body may not be able to make enough new cells to replace the old cells. The amount of bone cells and bone tissue you have is referred to as bone mass. The higher your bone mass, the stronger your bones. The aging process leads to an overall loss of bone mass in the body, which can increase the likelihood of:  Joint pain and stiffness.  Broken bones.  A condition in which the bones become weak and brittle (osteoporosis). A large decline in bone mass occurs in older adults. In women, it occurs about the time of menopause. What actions can I take to keep my bones healthy? Good health habits are important for maintaining healthy bones. This includes eating nutritious foods and exercising regularly. To have healthy bones, you need to get enough of the right minerals and vitamins. Most nutrition experts recommend getting these nutrients from the  foods that you eat. In some cases, taking supplements may also be recommended. Doing certain types of exercise is also important for bone health. What are the nutritional recommendations for healthy bones?  Eating a well-balanced diet with plenty of calcium and vitamin D will help to protect your bones. Nutritional recommendations vary from person to person. Ask your health care provider what is healthy for you. Here are some general guidelines. Get enough calcium Calcium is the most important (essential) mineral for bone health. Most people can get enough calcium from their diet, but supplements may be recommended for people who are at risk for osteoporosis. Good sources of calcium include:  Dairy products, such as low-fat or nonfat milk, cheese, and yogurt.  Dark green leafy vegetables, such as bok choy and broccoli.  Calcium-fortified foods, such as orange juice, cereal, bread, soy beverages, and tofu products.  Nuts, such as almonds. Follow these recommended amounts for daily calcium intake:  Children, age 1-3: 700 mg.  Children, age 4-8: 1,000 mg.  Children, age 9-13: 1,300 mg.  Teens, age 14-18: 1,300 mg.  Adults, age 19-50: 1,000 mg.  Adults, age 51-70: ? Men: 1,000 mg. ? Women: 1,200 mg.  Adults, age 71 or older: 1,200 mg.  Pregnant and breastfeeding females: ? Teens: 1,300 mg. ? Adults: 1,000 mg. Get enough vitamin D Vitamin D is the most essential vitamin for bone health. It helps the body absorb calcium. Sunlight stimulates the skin to make vitamin D, so be sure to get enough sunlight. If you live in a cold climate or you do not get outside often, your health care provider may recommend that you take vitamin D supplements. Good sources of vitamin D in your diet include:  Egg yolks.  Saltwater fish.  Milk and cereal fortified with vitamin D. Follow these recommended amounts for daily vitamin D intake:  Children and teens, age 1-18: 600 international  units.  Adults, age 50 or younger: 400-800 international units.  Adults, age 51 or older: 800-1,000 international units. Get other important nutrients Other nutrients that are important for bone health include:  Phosphorus. This mineral is found in meat, poultry, dairy foods, nuts, and legumes. The recommended daily intake for adult men and adult women is 700 mg.  Magnesium. This mineral is found in seeds, nuts, dark   green vegetables, and legumes. The recommended daily intake for adult men is 400-420 mg. For adult women, it is 310-320 mg.  Vitamin K. This vitamin is found in green leafy vegetables. The recommended daily intake is 120 mg for adult men and 90 mg for adult women. What type of physical activity is best for building and maintaining healthy bones? Weight-bearing and strength-building activities are important for building and maintaining healthy bones. Weight-bearing activities cause muscles and bones to work against gravity. Strength-building activities increase the strength of the muscles that support bones. Weight-bearing and muscle-building activities include:  Walking and hiking.  Jogging and running.  Dancing.  Gym exercises.  Lifting weights.  Tennis and racquetball.  Climbing stairs.  Aerobics. Adults should get at least 30 minutes of moderate physical activity on most days. Children should get at least 60 minutes of moderate physical activity on most days. Ask your health care provider what type of exercise is best for you. How can I find out if my bone mass is low? Bone mass can be measured with an X-ray test called a bone mineral density (BMD) test. This test is recommended for all women who are age 65 or older. It may also be recommended for:  Men who are age 70 or older.  People who are at risk for osteoporosis because of: ? Having bones that break easily. ? Having a long-term disease that weakens bones, such as kidney disease or rheumatoid  arthritis. ? Having menopause earlier than normal. ? Taking medicine that weakens bones, such as steroids, thyroid hormones, or hormone treatment for breast cancer or prostate cancer. ? Smoking. ? Drinking three or more alcoholic drinks a day. If you find that you have a low bone mass, you may be able to prevent osteoporosis or further bone loss by changing your diet and lifestyle. Where can I find more information? For more information, check out the following websites:  National Osteoporosis Foundation: www.nof.org/patients  National Institutes of Health: www.bones.nih.gov  International Osteoporosis Foundation: www.iofbonehealth.org Summary  The aging process leads to an overall loss of bone mass in the body, which can increase the likelihood of broken bones and osteoporosis.  Eating a well-balanced diet with plenty of calcium and vitamin D will help to protect your bones.  Weight-bearing and strength-building activities are also important for building and maintaining strong bones.  Bone mass can be measured with an X-ray test called a bone mineral density (BMD) test. This information is not intended to replace advice given to you by your health care provider. Make sure you discuss any questions you have with your health care provider. Document Revised: 10/25/2017 Document Reviewed: 10/25/2017 Elsevier Patient Education  2020 Elsevier Inc.   

## 2020-06-27 ENCOUNTER — Other Ambulatory Visit: Payer: Self-pay | Admitting: Obstetrics and Gynecology

## 2020-06-28 LAB — CYTOLOGY - PAP
Comment: NEGATIVE
Diagnosis: NEGATIVE
High risk HPV: NEGATIVE

## 2020-06-28 LAB — URINALYSIS, ROUTINE W REFLEX MICROSCOPIC
Bilirubin, UA: NEGATIVE
Glucose, UA: NEGATIVE
Ketones, UA: NEGATIVE
Nitrite, UA: NEGATIVE
Protein,UA: NEGATIVE
RBC, UA: NEGATIVE
Specific Gravity, UA: 1.007 (ref 1.005–1.030)
Urobilinogen, Ur: 0.2 mg/dL (ref 0.2–1.0)
pH, UA: 6.5 (ref 5.0–7.5)

## 2020-06-28 LAB — MICROSCOPIC EXAMINATION
Bacteria, UA: NONE SEEN
Casts: NONE SEEN /lpf
RBC: NONE SEEN /hpf (ref 0–2)

## 2020-07-15 ENCOUNTER — Telehealth (INDEPENDENT_AMBULATORY_CARE_PROVIDER_SITE_OTHER): Payer: Self-pay | Admitting: Gastroenterology

## 2020-07-15 ENCOUNTER — Other Ambulatory Visit: Payer: Self-pay

## 2020-07-15 DIAGNOSIS — Z1211 Encounter for screening for malignant neoplasm of colon: Secondary | ICD-10-CM

## 2020-07-15 MED ORDER — NA SULFATE-K SULFATE-MG SULF 17.5-3.13-1.6 GM/177ML PO SOLN: 1.0000 | Freq: Once | ORAL | 0 refills | Status: AC

## 2020-07-15 NOTE — Progress Notes (Signed)
Gastroenterology Pre-Procedure Review  Request Date: Friday 08/09/20 Requesting Physician: Dr. Allen Norris  PATIENT REVIEW QUESTIONS: The patient responded to the following health history questions as indicated:    1. Are you having any GI issues? no 2. Do you have a personal history of Polyps? no 3. Do you have a family history of Colon Cancer or Polyps? patient states sister has a spot on her colon 4. Diabetes Mellitus? no 5. Joint replacements in the past 12 months?no 6. Major health problems in the past 3 months?no 7. Any artificial heart valves, MVP, or defibrillator?history of bradycardia in 2018 cardiac clearance sent to Dr. Nehemiah Massed    MEDICATIONS & ALLERGIES:    Patient reports the following regarding taking any anticoagulation/antiplatelet therapy:   Plavix, Coumadin, Eliquis, Xarelto, Lovenox, Pradaxa, Brilinta, or Effient? no Aspirin? yes (81 mg daily)  Patient confirms/reports the following medications:  Current Outpatient Medications  Medication Sig Dispense Refill  . aspirin 81 MG chewable tablet Chew by mouth.    Marland Kitchen b complex vitamins capsule Take 1 capsule by mouth daily.    . Cholecalciferol 25 MCG (1000 UT) capsule Take by mouth.    . Na Sulfate-K Sulfate-Mg Sulf 17.5-3.13-1.6 GM/177ML SOLN Take 1 kit by mouth once for 1 dose. 354 mL 0   No current facility-administered medications for this visit.    Patient confirms/reports the following allergies:  Allergies  Allergen Reactions  . Iodinated Diagnostic Agents Other (See Comments)    Bradycardia, Heart block.     No orders of the defined types were placed in this encounter.   AUTHORIZATION INFORMATION Primary Insurance: 1D#: Group #:  Secondary Insurance: 1D#: Group #:  SCHEDULE INFORMATION: Date: Friday 08/09/20 Time: Location:[facility]

## 2020-08-01 ENCOUNTER — Encounter: Payer: Self-pay | Admitting: Gastroenterology

## 2020-08-07 ENCOUNTER — Other Ambulatory Visit: Payer: Self-pay

## 2020-08-07 ENCOUNTER — Other Ambulatory Visit
Admission: RE | Admit: 2020-08-07 | Discharge: 2020-08-07 | Disposition: A | Discharge status: Home / Self Care | Payer: 59 | Source: Ambulatory Visit | Attending: Gastroenterology | Admitting: Gastroenterology

## 2020-08-07 DIAGNOSIS — Z20822 Contact with and (suspected) exposure to covid-19: Secondary | ICD-10-CM | POA: Insufficient documentation

## 2020-08-07 DIAGNOSIS — Z01812 Encounter for preprocedural laboratory examination: Secondary | ICD-10-CM | POA: Insufficient documentation

## 2020-08-07 LAB — SARS CORONAVIRUS 2 (TAT 6-24 HRS): SARS Coronavirus 2: NEGATIVE

## 2020-08-08 NOTE — Discharge Instructions (Signed)
General Anesthesia, Adult, Care After This sheet gives you information about how to care for yourself after your procedure. Your health care provider may also give you more specific instructions. If you have problems or questions, contact your health care provider. What can I expect after the procedure? After the procedure, the following side effects are common:  Pain or discomfort at the IV site.  Nausea.  Vomiting.  Sore throat.  Trouble concentrating.  Feeling cold or chills.  Weak or tired.  Sleepiness and fatigue.  Soreness and body aches. These side effects can affect parts of the body that were not involved in surgery. Follow these instructions at home:  For at least 24 hours after the procedure:  Have a responsible adult stay with you. It is important to have someone help care for you until you are awake and alert.  Rest as needed.  Do not: ? Participate in activities in which you could fall or become injured. ? Drive. ? Use heavy machinery. ? Drink alcohol. ? Take sleeping pills or medicines that cause drowsiness. ? Make important decisions or sign legal documents. ? Take care of children on your own. Eating and drinking  Follow any instructions from your health care provider about eating or drinking restrictions.  When you feel hungry, start by eating small amounts of foods that are soft and easy to digest (bland), such as toast. Gradually return to your regular diet.  Drink enough fluid to keep your urine pale yellow.  If you vomit, rehydrate by drinking water, juice, or clear broth. General instructions  If you have sleep apnea, surgery and certain medicines can increase your risk for breathing problems. Follow instructions from your health care provider about wearing your sleep device: ? Anytime you are sleeping, including during daytime naps. ? While taking prescription pain medicines, sleeping medicines, or medicines that make you drowsy.  Return to  your normal activities as told by your health care provider. Ask your health care provider what activities are safe for you.  Take over-the-counter and prescription medicines only as told by your health care provider.  If you smoke, do not smoke without supervision.  Keep all follow-up visits as told by your health care provider. This is important. Contact a health care provider if:  You have nausea or vomiting that does not get better with medicine.  You cannot eat or drink without vomiting.  You have pain that does not get better with medicine.  You are unable to pass urine.  You develop a skin rash.  You have a fever.  You have redness around your IV site that gets worse. Get help right away if:  You have difficulty breathing.  You have chest pain.  You have blood in your urine or stool, or you vomit blood. Summary  After the procedure, it is common to have a sore throat or nausea. It is also common to feel tired.  Have a responsible adult stay with you for the first 24 hours after general anesthesia. It is important to have someone help care for you until you are awake and alert.  When you feel hungry, start by eating small amounts of foods that are soft and easy to digest (bland), such as toast. Gradually return to your regular diet.  Drink enough fluid to keep your urine pale yellow.  Return to your normal activities as told by your health care provider. Ask your health care provider what activities are safe for you. This information is not   intended to replace advice given to you by your health care provider. Make sure you discuss any questions you have with your health care provider. Document Revised: 10/01/2017 Document Reviewed: 05/14/2017 Elsevier Patient Education  2020 Else[employer] Inc.  

## 2020-08-09 ENCOUNTER — Encounter
Admission: RE | Disposition: A | Discharge status: Home / Self Care | Payer: Self-pay | Source: Home / Self Care | Attending: Gastroenterology

## 2020-08-09 ENCOUNTER — Encounter: Payer: Self-pay | Admitting: Gastroenterology

## 2020-08-09 ENCOUNTER — Other Ambulatory Visit: Payer: Self-pay

## 2020-08-09 ENCOUNTER — Ambulatory Visit: Payer: 59 | Admitting: Anesthesiology

## 2020-08-09 ENCOUNTER — Ambulatory Visit
Admission: RE | Admit: 2020-08-09 | Discharge: 2020-08-09 | Disposition: A | Discharge status: Home / Self Care | Payer: 59 | Attending: Gastroenterology | Admitting: Gastroenterology

## 2020-08-09 DIAGNOSIS — K635 Polyp of colon: Secondary | ICD-10-CM | POA: Diagnosis not present

## 2020-08-09 DIAGNOSIS — K640 First degree hemorrhoids: Secondary | ICD-10-CM | POA: Diagnosis not present

## 2020-08-09 DIAGNOSIS — Z1211 Encounter for screening for malignant neoplasm of colon: Secondary | ICD-10-CM | POA: Diagnosis present

## 2020-08-09 DIAGNOSIS — D124 Benign neoplasm of descending colon: Secondary | ICD-10-CM | POA: Insufficient documentation

## 2020-08-09 DIAGNOSIS — Z91041 Radiographic dye allergy status: Secondary | ICD-10-CM | POA: Diagnosis not present

## 2020-08-09 HISTORY — DX: Unspecified osteoarthritis, unspecified site: M19.90

## 2020-08-09 HISTORY — PX: COLONOSCOPY WITH PROPOFOL: SHX5780

## 2020-08-09 HISTORY — PX: POLYPECTOMY: SHX5525

## 2020-08-09 HISTORY — DX: Gastro-esophageal reflux disease without esophagitis: K21.9

## 2020-08-09 SURGERY — COLONOSCOPY WITH PROPOFOL
Anesthesia: General | Site: Rectum

## 2020-08-09 MED ORDER — SODIUM CHLORIDE 0.9 % IV SOLN: INTRAVENOUS | Status: DC

## 2020-08-09 MED ORDER — ACETAMINOPHEN 160 MG/5ML PO SOLN: 325.0000 mg | Freq: Once | ORAL | Status: DC

## 2020-08-09 MED ORDER — LIDOCAINE HCL (CARDIAC) PF 100 MG/5ML IV SOSY
PREFILLED_SYRINGE | INTRAVENOUS | Status: DC | PRN
  Administered 2020-08-09: 50 mg via INTRAVENOUS

## 2020-08-09 MED ORDER — PROPOFOL 10 MG/ML IV BOLUS
INTRAVENOUS | Status: DC | PRN
  Administered 2020-08-09: 30 mg via INTRAVENOUS
  Administered 2020-08-09: 70 mg via INTRAVENOUS
  Administered 2020-08-09 (×4): 20 mg via INTRAVENOUS

## 2020-08-09 MED ORDER — STERILE WATER FOR IRRIGATION IR SOLN: Status: DC | PRN

## 2020-08-09 MED ORDER — ACETAMINOPHEN 325 MG PO TABS: 325.0000 mg | ORAL_TABLET | Freq: Once | ORAL | Status: DC

## 2020-08-09 MED ORDER — LACTATED RINGERS IV SOLN: INTRAVENOUS | Status: DC

## 2020-08-09 SURGICAL SUPPLY — 8 items
GOWN CVR UNV OPN BCK APRN NK (MISCELLANEOUS) ×2 IMPLANT
GOWN ISOL THUMB LOOP REG UNIV (MISCELLANEOUS) ×6
KIT PRC NS LF DISP ENDO (KITS) ×1 IMPLANT
KIT PROCEDURE OLYMPUS (KITS) ×3
MANIFOLD NEPTUNE II (INSTRUMENTS) ×3 IMPLANT
SNARE SHORT THROW 13M SML OVAL (MISCELLANEOUS) ×3 IMPLANT
TRAP ETRAP POLY (MISCELLANEOUS) ×3 IMPLANT
WATER STERILE IRR 250ML POUR (IV SOLUTION) ×3 IMPLANT

## 2020-08-09 NOTE — Op Note (Signed)
[facility] Patient Name: Rhonda Monroe Procedure Date: 08/09/2020 8:33 AM MRN: [id number] Account #: [id number] Date of Birth: 05/21/1961 Admit Type: Outpatient Age: 59 Room: [facility] OR ROOM 01 Gender: Female Note Status: Finalized Procedure:             Colonoscopy Indications:           Screening for colorectal malignant neoplasm Providers:             Lucilla Lame MD, MD Referring MD:          Rusty Aus, MD (Referring MD) Medicines:             Propofol per Anesthesia Complications:         No immediate complications. Procedure:             Pre-Anesthesia Assessment:                        - Prior to the procedure, a History and Physical was                         performed, and patient medications and allergies were                         reviewed. The patient's tolerance of previous                         anesthesia was also reviewed. The risks and benefits                         of the procedure and the sedation options and risks                         were discussed with the patient. All questions were                         answered, and informed consent was obtained. Prior                         Anticoagulants: The patient has taken no previous                         anticoagulant or antiplatelet agents. ASA Grade                         Assessment: II - A patient with mild systemic disease.                         After reviewing the risks and benefits, the patient                         was deemed in satisfactory condition to undergo the                         procedure.                        After obtaining informed consent, the colonoscope was  passed under direct vision. Throughout the procedure,                         the patient's blood pressure, pulse, and oxygen                         saturations were monitored continuously. The was                         introduced through the anus and  advanced to the the                         cecum, identified by appendiceal orifice and ileocecal                         valve. The colonoscopy was performed without                         difficulty. The patient tolerated the procedure well.                         The quality of the bowel preparation was excellent. Findings:      The perianal and digital rectal examinations were normal.      A 4 mm polyp was found in the descending colon. The polyp was sessile.       The polyp was removed with a cold snare. Resection and retrieval were       complete.      Non-bleeding internal hemorrhoids were found during retroflexion. The       hemorrhoids were Grade I (internal hemorrhoids that do not prolapse). Impression:            - One 4 mm polyp in the descending colon, removed with                         a cold snare. Resected and retrieved.                        - Non-bleeding internal hemorrhoids. Recommendation:        - Discharge patient to home.                        - Resume previous diet.                        - Continue present medications.                        - Await pathology results.                        - Repeat colonoscopy in 7 years if polyp adenoma and                         10 years if hyperplastic Procedure Code(s):     --- Professional ---                        [phone], Colonoscopy, flexible; with removal of  tumor(s), polyp(s), or other lesion(s) by snare                         technique Diagnosis Code(s):     --- Professional ---                        Z12.11, Encounter for screening for malignant neoplasm                         of colon                        K63.5, Polyp of colon CPT copyright 2019 American Medical Association. All rights reserved. The codes documented in this report are preliminary and upon coder review may  be revised to meet current compliance requirements. Lucilla Lame MD, MD 08/09/2020 8:54:57 AM This report  has been signed electronically. Number of Addenda: 0 Note Initiated On: 08/09/2020 8:33 AM Scope Withdrawal Time: 0 hours 7 minutes 45 seconds  Total Procedure Duration: 0 hours 10 minutes 57 seconds  Estimated Blood Loss:  Estimated blood loss: none.      [facility]

## 2020-08-09 NOTE — Anesthesia Postprocedure Evaluation (Signed)
Anesthesia Post Note  Patient: Rhonda Monroe  Procedure(s) Performed: COLONOSCOPY WITH BIOPSY (N/A Rectum) POLYPECTOMY (N/A Rectum)     Patient location during evaluation: PACU Anesthesia Type: General Level of consciousness: awake and alert and oriented Pain management: satisfactory to patient Vital Signs Assessment: post-procedure vital signs reviewed and stable Respiratory status: spontaneous breathing, nonlabored ventilation and respiratory function stable Cardiovascular status: blood pressure returned to baseline and stable Postop Assessment: Adequate PO intake and No signs of nausea or vomiting Anesthetic complications: no   No complications documented.  Raliegh Ip

## 2020-08-09 NOTE — Anesthesia Preprocedure Evaluation (Signed)
Anesthesia Evaluation  Patient identified by MRN, date of birth, ID band Patient awake    Reviewed: Allergy & Precautions, H&P , NPO status , Patient's Chart, lab work & pertinent test results  Airway Mallampati: II  TM Distance: >3 FB Neck ROM: full    Dental no notable dental hx.    Pulmonary    Pulmonary exam normal breath sounds clear to auscultation       Cardiovascular Normal cardiovascular exam Rhythm:regular Rate:Normal     Neuro/Psych    GI/Hepatic GERD  ,  Endo/Other    Renal/GU      Musculoskeletal   Abdominal   Peds  Hematology   Anesthesia Other Findings   Reproductive/Obstetrics                             Anesthesia Physical Anesthesia Plan  ASA: II  Anesthesia Plan: General   Post-op Pain Management:    Induction: Intravenous  PONV Risk Score and Plan: 3 and Treatment may vary due to age or medical condition, TIVA and Propofol infusion  Airway Management Planned: Natural Airway  Additional Equipment:   Intra-op Plan:   Post-operative Plan:   Informed Consent: I have reviewed the patients History and Physical, chart, labs and discussed the procedure including the risks, benefits and alternatives for the proposed anesthesia with the patient or authorized representative who has indicated his/her understanding and acceptance.     Dental Advisory Given  Plan Discussed with: CRNA  Anesthesia Plan Comments:         Anesthesia Quick Evaluation

## 2020-08-09 NOTE — Anesthesia Procedure Notes (Signed)
Performed by: Jaionna Weisse, CRNA Pre-anesthesia Checklist: Patient identified, Emergency Drugs available, Suction available, Timeout performed and Patient being monitored Patient Re-evaluated:Patient Re-evaluated prior to induction Oxygen Delivery Method: Nasal cannula Placement Confirmation: positive ETCO2       

## 2020-08-09 NOTE — H&P (Signed)
Lucilla Lame, MD [address] [place], [place] [address] Phone: [phone] Fax : [phone]  Primary Care Physician:  Rusty Aus, MD Primary Gastroenterologist:  Dr. Allen Norris  Pre-Procedure History & Physical: HPI:  Rhonda Monroe is a 59 y.o. female is here for a screening colonoscopy.   Past Medical History:  Diagnosis Date  . Arthritis    spine?  . GERD (gastroesophageal reflux disease)     Past Surgical History:  Procedure Laterality Date  . CESAREAN SECTION    . LEFT HEART CATH N/A 11/23/2016   Procedure: Left Heart Cath and Coronary Angiography;  Surgeon: Corey Skains, MD;  Location: [facility];  Service: Cardiovascular;  Laterality: N/A;  . TONSILLECTOMY      Prior to Admission medications   Medication Sig Start Date End Date Taking? Authorizing Provider  aspirin 81 MG chewable tablet Chew by mouth.   Yes [provider]  b complex vitamins capsule Take 1 capsule by mouth daily.   Yes [provider]  Cholecalciferol 25 MCG (1000 UT) capsule Take by mouth.   Yes [provider]    Allergies as of 07/15/2020 - Review Complete 07/15/2020  Allergen Reaction Noted  . Iodinated diagnostic agents Other (See Comments) 11/30/2016    Family History  Problem Relation Age of Onset  . Dementia Mother     Social History   Socioeconomic History  . Marital status: Married    Spouse name: Not on file  . Number of children: Not on file  . Years of education: Not on file  . Highest education level: Not on file  Occupational History  . Not on file  Tobacco Use  . Smoking status: Never Smoker  . Smokeless tobacco: Never Used  Substance and Sexual Activity  . Alcohol use: No  . Drug use: No  . Sexual activity: Not on file  Other Topics Concern  . Not on file  Social History Narrative  . Not on file   Social Determinants of Health   Financial Resource Strain:   . Difficulty of Paying Living Expenses: Not  on file  Food Insecurity:   . Worried About Charity fundraiser in the Last Year: Not on file  . Ran Out of Food in the Last Year: Not on file  Transportation Needs:   . Lack of Transportation (Medical): Not on file  . Lack of Transportation (Non-Medical): Not on file  Physical Activity:   . Days of Exercise per Week: Not on file  . Minutes of Exercise per Session: Not on file  Stress:   . Feeling of Stress : Not on file  Social Connections:   . Frequency of Communication with Friends and Family: Not on file  . Frequency of Social Gatherings with Friends and Family: Not on file  . Attends Religious Services: Not on file  . Active Member of Clubs or Organizations: Not on file  . Attends Archivist Meetings: Not on file  . Marital Status: Not on file  Intimate Partner Violence:   . Fear of Current or Ex-Partner: Not on file  . Emotionally Abused: Not on file  . Physically Abused: Not on file  . Sexually Abused: Not on file    Review of Systems: See HPI, otherwise negative ROS  Physical Exam: BP (!) 141/82   Pulse 75   Temp 97.9 F (36.6 C) (Temporal)   Resp 16   Ht 5\' 5"  (1.651 m)  Wt 73.5 kg   SpO2 96%   BMI 26.96 kg/m  General:   Alert,  pleasant and cooperative in NAD Head:  Normocephalic and atraumatic. Neck:  Supple; no masses or thyromegaly. Lungs:  Clear throughout to auscultation.    Heart:  Regular rate and rhythm. Abdomen:  Soft, nontender and nondistended. Normal bowel sounds, without guarding, and without rebound.   Neurologic:  Alert and  oriented x4;  grossly normal neurologically.  Impression/Plan: Rhonda Monroe is now here to undergo a screening colonoscopy.  Risks, benefits, and alternatives regarding colonoscopy have been reviewed with the patient.  Questions have been answered.  All parties agreeable.

## 2020-08-09 NOTE — Transfer of Care (Signed)
Immediate Anesthesia Transfer of Care Note  Patient: Rhonda Monroe  Procedure(s) Performed: COLONOSCOPY WITH BIOPSY (N/A Rectum) POLYPECTOMY (N/A Rectum)  Patient Location: PACU  Anesthesia Type: General  Level of Consciousness: awake, alert  and patient cooperative  Airway and Oxygen Therapy: Patient Spontanous Breathing and Patient connected to supplemental oxygen  Post-op Assessment: Post-op Vital signs reviewed, Patient's Cardiovascular Status Stable, Respiratory Function Stable, Patent Airway and No signs of Nausea or vomiting  Post-op Vital Signs: Reviewed and stable  Complications: No complications documented.

## 2020-08-13 LAB — SURGICAL PATHOLOGY

## 2020-08-14 ENCOUNTER — Encounter: Payer: Self-pay | Admitting: Gastroenterology

## 2020-10-08 ENCOUNTER — Other Ambulatory Visit: Payer: Self-pay | Admitting: Internal Medicine

## 2020-10-08 DIAGNOSIS — R1011 Right upper quadrant pain: Secondary | ICD-10-CM

## 2020-10-18 ENCOUNTER — Ambulatory Visit: Payer: 59

## 2020-10-30 ENCOUNTER — Ambulatory Visit: Payer: 59 | Admitting: Dermatology

## 2020-11-20 ENCOUNTER — Ambulatory Visit: Admission: RE | Admit: 2020-11-20 | Payer: 59 | Source: Ambulatory Visit

## 2020-11-20 ENCOUNTER — Other Ambulatory Visit: Payer: Self-pay

## 2020-11-20 ENCOUNTER — Ambulatory Visit
Admission: RE | Admit: 2020-11-20 | Discharge: 2020-11-20 | Disposition: A | Discharge status: Home / Self Care | Payer: 59 | Source: Ambulatory Visit | Attending: Internal Medicine | Admitting: Internal Medicine

## 2020-11-20 DIAGNOSIS — R1011 Right upper quadrant pain: Secondary | ICD-10-CM | POA: Insufficient documentation

## 2020-11-20 LAB — POCT I-STAT CREATININE: Creatinine, Ser: 0.7 mg/dL (ref 0.44–1.00)

## 2020-11-20 MED ORDER — IOHEXOL 300 MG/ML  SOLN
100.0000 mL | Freq: Once | INTRAMUSCULAR | Status: AC | PRN
  Administered 2020-11-20: 100 mL via INTRAVENOUS

## 2021-01-23 ENCOUNTER — Encounter: Payer: Self-pay | Admitting: Dermatology

## 2021-01-23 ENCOUNTER — Other Ambulatory Visit: Payer: Self-pay

## 2021-01-23 ENCOUNTER — Ambulatory Visit (INDEPENDENT_AMBULATORY_CARE_PROVIDER_SITE_OTHER): Payer: 59 | Admitting: Dermatology

## 2021-01-23 DIAGNOSIS — L578 Other skin changes due to chronic exposure to nonionizing radiation: Secondary | ICD-10-CM | POA: Diagnosis not present

## 2021-01-23 DIAGNOSIS — L720 Epidermal cyst: Secondary | ICD-10-CM

## 2021-01-23 DIAGNOSIS — L7211 Pilar cyst: Secondary | ICD-10-CM | POA: Diagnosis not present

## 2021-01-23 DIAGNOSIS — L821 Other seborrheic keratosis: Secondary | ICD-10-CM

## 2021-01-23 DIAGNOSIS — R202 Paresthesia of skin: Secondary | ICD-10-CM | POA: Diagnosis not present

## 2021-01-23 NOTE — Progress Notes (Addendum)
   New Patient Visit   Subjective  Rhonda Monroe is a 60 y.o. female who presents for the following: Follow-up (Patient here today concerning a dark spot that has been present for years near bra strap that itches. Patient has concerns with sebaceous cyst at chest and a small cyst at back of head. Patient reports no personal history of skin cancer. Patient reports she thinks her dad may have some skin cancer when he was older but she was not sure. ).  The following portions of the chart were reviewed this encounter and updated as appropriate:  Tobacco  Allergies  Meds  Problems  Med Hx  Surg Hx  Fam Hx      Objective  Well appearing patient in no apparent distress; mood and affect are within normal limits.  A focused examination was performed including back, occipital scalp, mid chest . Relevant physical exam findings are noted in the Assessment and Plan.  Objective  mid back right of midline: Hyperpigmented and hypopigmented patch mid back right of midline   Objective  mid chest: Subcutaneous nodule.   Objective  occipital scalp: 0.7 cm Subcutaneous nodule at scalp.   Assessment & Plan    Seborrheic Keratoses - Stuck-on, waxy, tan-brown papules and/or plaques at mid back - Benign-appearing - Discussed benign etiology and prognosis. 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  Recommend OTC Gold Bond Rapid Relief Anti-Itch cream (pramoxine + menthol) up to 3 times per day to areas that are itchy.    Epidermal inclusion cyst mid chest  Benign-appearing. Exam most consistent with an epidermal inclusion cyst. Discussed that a cyst is a benign growth that can grow over time and sometimes get irritated or inflamed. Recommend observation if it is not bothersome. Discussed option of surgical excision to remove  it if it is growing, symptomatic, or other changes noted. Please call for new or changing lesions so they can be evaluated.     Pilar cyst occipital scalp  Benign-appearing. Exam most consistent with an epidermal inclusion cyst. Discussed that a cyst is a benign growth that can grow over time and sometimes get irritated or inflamed. Recommend observation if it is not bothersome. Discussed option of surgical excision to remove it if it is growing, symptomatic, or other changes noted. Please call for new or changing lesions so they can be evaluated.     Actinic Damage - chronic, secondary to cumulative UV radiation exposure/sun exposure over time - diffuse scaly erythematous macules with underlying dyspigmentation - Recommend daily broad spectrum sunscreen SPF 30+ to sun-exposed areas, reapply every 2 hours as needed.  - Recommend staying in the shade or wearing long sleeves, sun glasses (UVA+UVB protection) and wide brim hats (4-inch brim around the entire circumference of the hat). - Call for new or changing lesions.  Return if symptoms worsen or fail to improve.  I, Ruthell Rummage, CMA, am acting as scribe for Forest Gleason, MD.  Documentation: I have reviewed the above documentation for accuracy and completeness, and I agree with the above.  Forest Gleason, MD

## 2021-01-23 NOTE — Patient Instructions (Addendum)
Recommend OTC Gold Bond Rapid Relief Anti-Itch cream (pramoxine + menthol) up to 3 times per day to areas that are itchy.  Recommend taking Heliocare sun protection supplement daily in sunny weather for additional sun protection. For maximum protection on the sunniest days, you can take up to 2 capsules of regular Heliocare OR take 1 capsule of Heliocare Ultra. For prolonged exposure (such as a full day in the sun), you can repeat your dose of the supplement 4 hours after your first dose. Heliocare can be purchased at [facility] or at [email or web address].   Recommend daily broad spectrum sunscreen SPF 30+ to sun-exposed areas, reapply every 2 hours as needed. Call for new or changing lesions.  Staying in the shade or wearing long sleeves, sun glasses (UVA+UVB protection) and wide brim hats (4-inch brim around the entire circumference of the hat) are also recommended for sun protection.   If you have any questions or concerns for your doctor, please call our main line at [phone] and press option 4 to reach your doctor's medical assistant. If no one answers, please leave a voicemail as directed and we will return your call as soon as possible. Messages left after 4 pm will be answered the following business day.   You may also send [place] a message via [place]. We typically respond to MyChart messages within 1-2 business days.  For prescription refills, please ask your pharmacy to contact our office. Our fax number is [phone].  If you have an urgent issue when the clinic is closed that cannot wait until the next business day, you can page your doctor at the number below.    Please note that while we do our best to be available for urgent issues outside of office hours, we are not available 24/7.   If you have an urgent issue and are unable to reach [place], you may choose to seek medical care at your doctor's office, retail clinic, urgent care center, or emergency room.  If you have a  medical emergency, please immediately call 911 or go to the emergency department.  Pager Numbers  - Dr. Nehemiah Massed: [phone]  - Dr. Laurence Ferrari: [phone]  - Dr. Nicole Kindred: [phone]  In the event of inclement weather, please call our main line at [phone] for an update on the status of any delays or closures.  Dermatology Medication Tips: Please keep the boxes that topical medications come in in order to help keep track of the instructions about where and how to use these. Pharmacies typically print the medication instructions only on the boxes and not directly on the medication tubes.   If your medication is too expensive, please contact our office at [phone] option 4 or send [place] a message through [place].   We are unable to tell what your co-pay for medications will be in advance as this is different depending on your insurance coverage. However, we may be able to find a substitute medication at lower cost or fill out paperwork to get insurance to cover a needed medication.   If a prior authorization is required to get your medication covered by your insurance company, please allow [place] 1-2 business days to complete this process.  Drug prices often vary depending on where the prescription is filled and some pharmacies may offer cheaper prices.  The website www.goodrx.com contains coupons for medications through different pharmacies. The prices here do not account for what the cost may be with help from insurance (it may be cheaper with your insurance),  but the website can give you the price if you did not use any insurance.  - You can print the associated coupon and take it with your prescription to the pharmacy.  - You may also stop by our office during regular business hours and pick up a GoodRx coupon card.  - If you need your prescription sent electronically to a different pharmacy, notify our office through [place] or by phone at [phone] option 4.  Melanoma  ABCDEs  Melanoma is the most dangerous type of skin cancer, and is the leading cause of death from skin disease.  You are more likely to develop melanoma if you:  Have light-colored skin, light-colored eyes, or red or blond hair  Spend a lot of time in the sun  Tan regularly, either outdoors or in a tanning bed  Have had blistering sunburns, especially during childhood  Have a close family member who has had a melanoma  Have atypical moles or large birthmarks  Early detection of melanoma is key since treatment is typically straightforward and cure rates are extremely high if we catch it early.   The first sign of melanoma is often a change in a mole or a new dark spot.  The ABCDE system is a way of remembering the signs of melanoma.  A for asymmetry:  The two halves do not match. B for border:  The edges of the growth are irregular. C for color:  A mixture of colors are present instead of an even brown color. D for diameter:  Melanomas are usually (but not always) greater than 49mm - the size of a pencil eraser. E for evolution:  The spot keeps changing in size, shape, and color.  Please check your skin once per month between visits. You can use a small mirror in front and a large mirror behind you to keep an eye on the back side or your body.   If you see any new or changing lesions before your next follow-up, please call to schedule a visit.  Please continue daily skin protection including broad spectrum sunscreen SPF 30+ to sun-exposed areas, reapplying every 2 hours as needed when you're outdoors.   Staying in the shade or wearing long sleeves, sun glasses (UVA+UVB protection) and wide brim hats (4-inch brim around the entire circumference of the hat) are also recommended for sun protection.   Seborrheic Keratosis  What causes seborrheic keratoses? Seborrheic keratoses are harmless, common skin growths that first appear during adult life.  As time goes by, more growths appear.   Some people may develop a large number of them.  Seborrheic keratoses appear on both covered and uncovered body parts.  They are not caused by sunlight.  The tendency to develop seborrheic keratoses can be inherited.  They vary in color from skin-colored to gray, brown, or even black.  They can be either smooth or have a rough, warty surface.   Seborrheic keratoses are superficial and look as if they were stuck on the skin.  Under the microscope this type of keratosis looks like layers upon layers of skin.  That is why at times the top layer may seem to fall off, but the rest of the growth remains and re-grows.    Treatment Seborrheic keratoses do not need to be treated, but can easily be removed in the office.  Seborrheic keratoses often cause symptoms when they rub on clothing or jewelry.  Lesions can be in the way of shaving.  If they  become inflamed, they can cause itching, soreness, or burning.  Removal of a seborrheic keratosis can be accomplished by freezing, burning, or surgery. If any spot bleeds, scabs, or grows rapidly, please return to have it checked, as these can be an indication of a skin cancer.

## 2021-05-31 ENCOUNTER — Emergency Department
Admission: EM | Admit: 2021-05-31 | Discharge: 2021-05-31 | Disposition: A | Discharge status: Home / Self Care | Payer: 59 | Attending: Emergency Medicine | Admitting: Emergency Medicine

## 2021-05-31 ENCOUNTER — Other Ambulatory Visit: Payer: Self-pay

## 2021-05-31 DIAGNOSIS — R002 Palpitations: Secondary | ICD-10-CM | POA: Diagnosis not present

## 2021-05-31 DIAGNOSIS — R350 Frequency of micturition: Secondary | ICD-10-CM | POA: Diagnosis present

## 2021-05-31 DIAGNOSIS — B9689 Other specified bacterial agents as the cause of diseases classified elsewhere: Secondary | ICD-10-CM | POA: Diagnosis not present

## 2021-05-31 DIAGNOSIS — Z7982 Long term (current) use of aspirin: Secondary | ICD-10-CM | POA: Insufficient documentation

## 2021-05-31 DIAGNOSIS — Z955 Presence of coronary angioplasty implant and graft: Secondary | ICD-10-CM | POA: Diagnosis not present

## 2021-05-31 DIAGNOSIS — N390 Urinary tract infection, site not specified: Secondary | ICD-10-CM | POA: Diagnosis not present

## 2021-05-31 LAB — CBC
HCT: 41.1 % (ref 36.0–46.0)
Hemoglobin: 13.8 g/dL (ref 12.0–15.0)
MCH: 29.4 pg (ref 26.0–34.0)
MCHC: 33.6 g/dL (ref 30.0–36.0)
MCV: 87.4 fL (ref 80.0–100.0)
Platelets: 241 10*3/uL (ref 150–400)
RBC: 4.7 MIL/uL (ref 3.87–5.11)
RDW: 11.8 % (ref 11.5–15.5)
WBC: 8.5 10*3/uL (ref 4.0–10.5)
nRBC: 0 % (ref 0.0–0.2)

## 2021-05-31 LAB — BASIC METABOLIC PANEL
Anion gap: 7 (ref 5–15)
BUN: 9 mg/dL (ref 8–23)
CO2: 27 mmol/L (ref 22–32)
Calcium: 9.2 mg/dL (ref 8.9–10.3)
Chloride: 96 mmol/L — ABNORMAL LOW (ref 98–111)
Creatinine, Ser: 0.74 mg/dL (ref 0.44–1.00)
GFR, Estimated: >60 mL/min (ref >60)
Glucose, Bld: 115 mg/dL — ABNORMAL HIGH (ref 70–99)
Potassium: 3.9 mmol/L (ref 3.5–5.1)
Sodium: 130 mmol/L — ABNORMAL LOW (ref 135–145)

## 2021-05-31 LAB — URINALYSIS, COMPLETE (UACMP) WITH MICROSCOPIC
Bilirubin Urine: NEGATIVE
Glucose, UA: NEGATIVE mg/dL
Ketones, ur: 20 mg/dL — AB
Nitrite: NEGATIVE
Protein, ur: 100 mg/dL — AB
RBC / HPF: >50 RBC/hpf — ABNORMAL HIGH (ref 0–5)
Specific Gravity, Urine: 1.008 (ref 1.005–1.030)
WBC, UA: >50 WBC/hpf — ABNORMAL HIGH (ref 0–5)
pH: 6 (ref 5.0–8.0)

## 2021-05-31 LAB — MAGNESIUM: Magnesium: 1.8 mg/dL (ref 1.7–2.4)

## 2021-05-31 LAB — TROPONIN I (HIGH SENSITIVITY): Troponin I (High Sensitivity): 4 ng/L (ref <18)

## 2021-05-31 MED ORDER — LIDOCAINE HCL (PF) 1 % IJ SOLN
5.0000 mL | Freq: Once | INTRAMUSCULAR | Status: AC
  Administered 2021-05-31: 5 mL
  Filled 2021-05-31: qty 5

## 2021-05-31 MED ORDER — CEFTRIAXONE SODIUM 1 G IJ SOLR
1.0000 g | Freq: Once | INTRAMUSCULAR | Status: AC
  Administered 2021-05-31: 1 g via INTRAMUSCULAR
  Filled 2021-05-31: qty 10

## 2021-05-31 MED ORDER — CEPHALEXIN 500 MG PO CAPS: 500.0000 mg | ORAL_CAPSULE | Freq: Two times a day (BID) | ORAL | 0 refills | Status: AC

## 2021-05-31 NOTE — Discharge Instructions (Addendum)
You may alternate Tylenol 1000 mg every 6 hours as needed for pain, fever and Ibuprofen 800 mg every 8 hours as needed for pain, fever.  Please take Ibuprofen with food.  Do not take more than 4000 mg of Tylenol (acetaminophen) in a 24 hour period.  

## 2021-05-31 NOTE — ED Triage Notes (Signed)
Pt reports was experiencing urinary symptoms this week treated with OTC medications, reports took some antibiotics she found and felt better but yesterday she called her PCP to get a prescription took 1 CIPRO reports woke up in the middle of the night feeling anxious, lower back pain and BP elevated. Pt denies any other symptoms, talks in complete sentences no distress noted

## 2021-05-31 NOTE — ED Provider Notes (Signed)
[facility] Emergency Department Provider Note  ____________________________________________   Event Date/Time   First MD Initiated Contact with Patient 05/31/21 [phone]     (approximate)  I have reviewed the triage vital signs and the nursing notes.   HISTORY  Chief Complaint Hypertension and Dysuria    HPI Rhonda Monroe is a 60 y.o. female with history of arthritis, GERD who presents to the emergency department with several days of dysuria, urinary frequency and urgency.  States she thought that she had a UTI and had some leftover amoxicillin which she started.  States symptoms slowly improved and then they worsened yesterday.  Called her doctor who placed her on Cipro.  She states she became concerned after reading potential side effects about Cipro.  She states overnight she had palpitations and she measured her blood pressure and it was as high as the 160/110's.  She denies any chest pain, shortness of breath, headache, vision changes, numbness, tingling, weakness, current abdominal pain or flank pain, vomiting or diarrhea.        Past Medical History:  Diagnosis Date   Arthritis    spine?   GERD (gastroesophageal reflux disease)     Patient Active Problem List   Diagnosis Date Noted   Encounter for screening colonoscopy    Polyp of descending colon    B12 deficiency 05/31/2017   Hyperlipidemia, mixed 01/18/2017   Non-ST elevation myocardial infarction (NSTEMI), subendocardial infarction, subsequent episode of care ([place]) 11/22/2016   Chest pain 11/21/2016   History of thyroid cyst 06/10/2015    Past Surgical History:  Procedure Laterality Date   CESAREAN SECTION     COLONOSCOPY WITH PROPOFOL N/A 08/09/2020   Procedure: COLONOSCOPY WITH BIOPSY;  Surgeon: Lucilla Lame, MD;  Location: [place];  Service: Endoscopy;  Laterality: N/A;  priority 4   LEFT HEART CATH N/A 11/23/2016   Procedure: Left Heart Cath and Coronary Angiography;   Surgeon: Corey Skains, MD;  Location: [place] [facility];  Service: Cardiovascular;  Laterality: N/A;   POLYPECTOMY N/A 08/09/2020   Procedure: POLYPECTOMY;  Surgeon: Lucilla Lame, MD;  Location: [place];  Service: Endoscopy;  Laterality: N/A;   TONSILLECTOMY      Prior to Admission medications   Medication Sig Start Date End Date Taking? Authorizing Provider  cephALEXin (KEFLEX) 500 MG capsule Take 1 capsule (500 mg total) by mouth 2 (two) times daily. 05/31/21  Yes Lakai Moree, Delice Bison, DO  aspirin 81 MG chewable tablet Chew by mouth.    [provider]  b complex vitamins capsule Take 1 capsule by mouth daily.    [provider]  Cholecalciferol 25 MCG (1000 UT) capsule Take by mouth.    [provider]    Allergies Beta adrenergic blockers  Family History  Problem Relation Age of Onset   Dementia Mother     Social History Social History   Tobacco Use   Smoking status: Never   Smokeless tobacco: Never  Substance Use Topics   Alcohol use: No   Drug use: No    Review of Systems Constitutional: No fever. Eyes: No visual changes. ENT: No sore throat. Cardiovascular: Denies chest pain. Respiratory: Denies shortness of breath. Gastrointestinal: No nausea, vomiting, diarrhea. Genitourinary: +for dysuria. Musculoskeletal: Negative for back pain. Skin: Negative for rash. Neurological: Negative for focal weakness or numbness.  ____________________________________________   PHYSICAL EXAM:  VITAL SIGNS: ED Triage Vitals  Enc Vitals Group     BP 05/31/21 0311 Marland Kitchen)  156/94     Pulse Rate 05/31/21 0311 68     Resp 05/31/21 0311 18     Temp 05/31/21 0311 98.2 F (36.8 C)     Temp Source 05/31/21 0311 Oral     SpO2 05/31/21 0311 98 %     Weight 05/31/21 0313 167 lb (75.8 kg)     Height 05/31/21 0313 '5\' 5"'$  (1.651 m)     Head Circumference --      Peak Flow --      Pain Score 05/31/21 0312 1     Pain Loc --      Pain Edu? --       Excl. in [place]? --    CONSTITUTIONAL: Alert and oriented and responds appropriately to questions. Well-appearing; well-nourished HEAD: Normocephalic EYES: Conjunctivae clear, pupils appear equal, EOM appear intact ENT: normal nose; moist mucous membranes NECK: Supple, normal ROM CARD: RRR; S1 and S2 appreciated; no murmurs, no clicks, no rubs, no gallops RESP: Normal chest excursion without splinting or tachypnea; breath sounds clear and equal bilaterally; no wheezes, no rhonchi, no rales, no hypoxia or respiratory distress, speaking full sentences ABD/GI: Normal bowel sounds; non-distended; soft, non-tender, no rebound, no guarding, no peritoneal signs, no hepatosplenomegaly BACK: The back appears normal EXT: Normal ROM in all joints; no deformity noted, no edema; no cyanosis SKIN: Normal color for age and race; warm; no rash on exposed skin NEURO: Moves all extremities equally PSYCH: The patient's mood and manner are appropriate.  ____________________________________________   LABS (all labs ordered are listed, but only abnormal results are displayed)  Labs Reviewed  BASIC METABOLIC PANEL - Abnormal; Notable for the following components:      Result Value   Sodium 130 (*)    Chloride 96 (*)    Glucose, Bld 115 (*)    All other components within normal limits  URINALYSIS, COMPLETE (UACMP) WITH MICROSCOPIC - Abnormal; Notable for the following components:   Color, Urine YELLOW (*)    APPearance HAZY (*)    Hgb urine dipstick LARGE (*)    Ketones, ur 20 (*)    Protein, ur 100 (*)    Leukocytes,Ua MODERATE (*)    RBC / HPF >50 (*)    WBC, UA >50 (*)    Bacteria, UA RARE (*)    All other components within normal limits  URINE CULTURE  CBC  MAGNESIUM  TROPONIN I (HIGH SENSITIVITY)   ____________________________________________  EKG   EKG Interpretation  Date/Time:  Saturday May 31 2021 03:16:15 EDT Ventricular Rate:  72 PR Interval:  146 QRS Duration: 90 QT  Interval:  400 QTC Calculation: 438 R Axis:   70 Text Interpretation: Normal sinus rhythm with sinus arrhythmia Normal ECG Confirmed by Pryor Curia [phone]) on 05/31/2021 6:06:31 AM        ____________________________________________  RADIOLOGY Jessie Foot Benjamim Harnish, personally viewed and evaluated these images (plain radiographs) as part of my medical decision making, as well as reviewing the written report by the radiologist.  ED MD interpretation:    Official radiology report(s): No results found.  ____________________________________________   PROCEDURES  Procedure(s) performed (including Critical Care):  Procedures   ____________________________________________   INITIAL IMPRESSION / ASSESSMENT AND PLAN / ED COURSE  As part of my medical decision making, I reviewed the following data within the [facility] notes reviewed and incorporated, Labs reviewed , EKG interpreted , Old EKG reviewed, Old chart reviewed, and Notes from prior ED visits  Patient here with urinary symptoms.  She does appear to have a urinary tract infection.  Culture is pending.  She is worried about being on ciprofloxacin given potential side effects.  We will start her on Keflex.  Given IM Rocephin here.  She has no abdominal pain, flank pain.  Doubt pyelonephritis, kidney stone, appendicitis.  She also reports episode of palpitations and hypertension.  This has improved.  No chest pain or shortness of breath.  Electrolytes within normal limits.  Troponin negative.  Normal hemoglobin.  EKG shows no ischemia, arrhythmia.  No events noted on cardiac monitoring.  Doubt ACS, life-threatening arrhythmia, dissection, PE.  Recommend close follow-up with her primary care doctor.  Repeat blood pressure is currently 139/85.  I feel she is safe for discharge home.  At this time, I do not feel there is any life-threatening condition present. I have reviewed, interpreted and discussed  all results (EKG, imaging, lab, urine as appropriate) and exam findings with patient/family. I have reviewed nursing notes and appropriate previous records.  I feel the patient is safe to be discharged home without further emergent workup and can continue workup as an outpatient as needed. Discussed usual and customary return precautions. Patient/family verbalize understanding and are comfortable with this plan.  Outpatient follow-up has been provided as needed. All questions have been answered.  ____________________________________________   FINAL CLINICAL IMPRESSION(S) / ED DIAGNOSES  Final diagnoses:  Acute UTI  Palpitations     ED Discharge Orders          Ordered    cephALEXin (KEFLEX) 500 MG capsule  2 times daily        05/31/21 0640            *Please note:  SLYVIA CURIEL was evaluated in Emergency Department on 05/31/2021 for the symptoms described in the history of present illness. She was evaluated in the context of the global COVID-19 pandemic, which necessitated consideration that the patient might be at risk for infection with the SARS-CoV-2 virus that causes COVID-19. Institutional protocols and algorithms that pertain to the evaluation of patients at risk for COVID-19 are in a state of rapid change based on information released by regulatory bodies including the CDC and federal and state organizations. These policies and algorithms were followed during the patient's care in the ED.  Some ED evaluations and interventions may be delayed as a result of limited staffing during and the pandemic.*   Note:  This document was prepared using Dragon voice recognition software and may include unintentional dictation errors.    Roberth Berling, Delice Bison, DO 05/31/21 [phone]

## 2021-06-01 LAB — URINE CULTURE: Culture: NO GROWTH

## 2021-06-27 ENCOUNTER — Encounter: Payer: Self-pay | Admitting: Obstetrics and Gynecology

## 2021-06-27 ENCOUNTER — Other Ambulatory Visit: Payer: Self-pay

## 2021-06-27 ENCOUNTER — Ambulatory Visit (INDEPENDENT_AMBULATORY_CARE_PROVIDER_SITE_OTHER): Payer: 59 | Admitting: Obstetrics and Gynecology

## 2021-06-27 VITALS — BP 128/78 | HR 80 | Ht 65.0 in | Wt 165.2 lb

## 2021-06-27 DIAGNOSIS — R300 Dysuria: Secondary | ICD-10-CM

## 2021-06-27 DIAGNOSIS — Z1231 Encounter for screening mammogram for malignant neoplasm of breast: Secondary | ICD-10-CM

## 2021-06-27 DIAGNOSIS — Z01419 Encounter for gynecological examination (general) (routine) without abnormal findings: Secondary | ICD-10-CM | POA: Diagnosis not present

## 2021-06-27 DIAGNOSIS — Z0000 Encounter for general adult medical examination without abnormal findings: Secondary | ICD-10-CM | POA: Diagnosis not present

## 2021-06-27 DIAGNOSIS — N952 Postmenopausal atrophic vaginitis: Secondary | ICD-10-CM

## 2021-06-27 DIAGNOSIS — Z1211 Encounter for screening for malignant neoplasm of colon: Secondary | ICD-10-CM

## 2021-06-27 DIAGNOSIS — Z124 Encounter for screening for malignant neoplasm of cervix: Secondary | ICD-10-CM | POA: Diagnosis not present

## 2021-06-27 LAB — POCT URINALYSIS DIPSTICK
Bilirubin, UA: NEGATIVE
Blood, UA: NEGATIVE
Glucose, UA: NEGATIVE
Ketones, UA: NEGATIVE
Leukocytes, UA: NEGATIVE
Nitrite, UA: NEGATIVE
Protein, UA: NEGATIVE
Spec Grav, UA: 1.02 (ref 1.010–1.025)
Urobilinogen, UA: 0.2 E.U./dL
pH, UA: 5 (ref 5.0–8.0)

## 2021-06-27 MED ORDER — ESTRADIOL 0.1 MG/GM VA CREA: 1.0000 | TOPICAL_CREAM | Freq: Every day | VAGINAL | 2 refills | Status: AC

## 2021-06-27 NOTE — Progress Notes (Signed)
Gynecology Annual Exam  PCP: Rusty Aus, MD  Chief Complaint:  Chief Complaint  Patient presents with   Gynecologic Exam    History of Present Illness: Patient is a 60 y.o. No obstetric history on file. presents for annual exam. The patient has no complaints today.   LMP: No LMP recorded. Patient is postmenopausal. She denies postmenopausal bleeding or spotting  The patient is sexually active. She denies dyspareunia.  Postcoital Bleeding: no   The patient does perform self breast exams.  There is no notable family history of breast or ovarian cancer in her family.  The patient has regular exercise: Reports that she stays active with her 4-year-old grandson 5 days a week.  The patient denies current symptoms of depression.   PHQ-9:  0 GAD-7: 0   She reports that she has been having of urine.  The urine leakage is constant and she needs to wear a pad.  She does not tend to have symptoms of stress incontinence,such as leakage with cough laugh or sneeze, unless she has a very full bladder.  She has had more issues this year for urinary tract infections.  She is not currently interested in pursuing treatment for this leakage of urine.  Review of Systems: Review of Systems  Constitutional:  Negative for chills, fever, malaise/fatigue and weight loss.  HENT:  Negative for congestion, hearing loss and sinus pain.   Eyes:  Negative for blurred vision and double vision.  Respiratory:  Negative for cough, sputum production, shortness of breath and wheezing.   Cardiovascular:  Negative for chest pain, palpitations, orthopnea and leg swelling.  Gastrointestinal:  Negative for abdominal pain, constipation, diarrhea, nausea and vomiting.  Genitourinary:  Negative for dysuria, flank pain, frequency, hematuria and urgency.  Musculoskeletal:  Negative for back pain, falls and joint pain.  Skin:  Negative for itching and rash.  Neurological:  Negative for dizziness and headaches.   Psychiatric/Behavioral:  Negative for depression, substance abuse and suicidal ideas. The patient is not nervous/anxious.    Past Medical History:  Past Medical History:  Diagnosis Date   Arthritis    spine?   GERD (gastroesophageal reflux disease)     Past Surgical History:  Past Surgical History:  Procedure Laterality Date   CESAREAN SECTION     COLONOSCOPY WITH PROPOFOL N/A 08/09/2020   Procedure: COLONOSCOPY WITH BIOPSY;  Surgeon: Lucilla Lame, MD;  Location: [place];  Service: Endoscopy;  Laterality: N/A;  priority 4   LEFT HEART CATH N/A 11/23/2016   Procedure: Left Heart Cath and Coronary Angiography;  Surgeon: Corey Skains, MD;  Location: [place] [facility];  Service: Cardiovascular;  Laterality: N/A;   POLYPECTOMY N/A 08/09/2020   Procedure: POLYPECTOMY;  Surgeon: Lucilla Lame, MD;  Location: [place];  Service: Endoscopy;  Laterality: N/A;   TONSILLECTOMY      Gynecologic History:  No LMP recorded. Patient is postmenopausal. Last Pap: Results were: 06/25/2020 NIL and HR HPV negative  Last mammogram: 11/07/2020 Results were: BI-RAD I  Obstetric History: No obstetric history on file.  Family History:  Family History  Problem Relation Age of Onset   Dementia Mother     Social History:  Social History   Socioeconomic History   Marital status: Married    Spouse name: Not on file   Number of children: Not on file   Years of education: Not on file   Highest education level: Not on file  Occupational History   Not  on file  Tobacco Use   Smoking status: Never   Smokeless tobacco: Never  Substance and Sexual Activity   Alcohol use: No   Drug use: No   Sexual activity: Not on file  Other Topics Concern   Not on file  Social History Narrative   Not on file   Social Determinants of Health   Financial Resource Strain: Not on file  Food Insecurity: Not on file  Transportation Needs: Not on file  Physical Activity: Not on file   Stress: Not on file  Social Connections: Not on file  Intimate Partner Violence: Not on file    Allergies:  Allergies  Allergen Reactions   Beta Adrenergic Blockers     Caused a dropped beat     Medications: Prior to Admission medications   Medication Sig Start Date End Date Taking? Authorizing Provider  aspirin 81 MG chewable tablet Chew by mouth.   Yes [provider]  b complex vitamins capsule Take 1 capsule by mouth daily.   Yes [provider]  cephALEXin (KEFLEX) 500 MG capsule Take 1 capsule (500 mg total) by mouth 2 (two) times daily. Patient not taking: Reported on 06/27/2021 05/31/21   Ward, Delice Bison, DO  Cholecalciferol 25 MCG (1000 UT) capsule Take by mouth. Patient not taking: Reported on 06/27/2021    [provider]    Physical Exam Vitals: Blood pressure 128/78, pulse 80, height '5\' 5"'$  (1.651 m), weight 165 lb 3.2 oz (74.9 kg), SpO2 98 %.  Physical Exam Constitutional:      Appearance: Normal appearance. She is well-developed.  Genitourinary:     Genitourinary Comments: External:Atrophic appearing vulva. No lesions noted.  Bimanual examination: Uterus midline, non-tender, normal in size, shape and contour.  No CMT. No adnexal masses. No adnexal tenderness. Pelvis not fixed.  Breast Exam: breast equal without skin changes, nipple discharge, breast lump or enlarged lymph nodes   HENT:     Head: Normocephalic and atraumatic.  Eyes:     Extraocular Movements: Extraocular movements intact.     Pupils: Pupils are equal, round, and reactive to light.  Neck:     Thyroid: No thyromegaly.  Cardiovascular:     Rate and Rhythm: Normal rate and regular rhythm.     Heart sounds: Normal heart sounds.  Pulmonary:     Effort: Pulmonary effort is normal.     Breath sounds: Normal breath sounds.  Abdominal:     General: Bowel sounds are normal. There is no distension.     Palpations: Abdomen is soft. There is no mass.  Musculoskeletal:      Cervical back: Neck supple.  Neurological:     Mental Status: She is alert and oriented to person, place, and time.  Skin:    General: Skin is warm and dry.  Psychiatric:        Behavior: Behavior normal.        Thought Content: Thought content normal.        Judgment: Judgment normal.  Vitals reviewed.     Female chaperone present for pelvic and breast  portions of the physical exam  Assessment: 60 y.o. No obstetric history on file. routine annual exam  Plan: Problem List Items Addressed This Visit   None Visit Diagnoses     Dysuria    -  Primary   Relevant Orders   POCT Urinalysis Dipstick (Completed)   Encounter for annual routine gynecological examination       Health maintenance examination  Breast cancer screening by mammogram       Relevant Orders   MM 3D SCREEN BREAST BILATERAL   Cervical cancer screening       Colon cancer screening       Encounter for gynecological examination without abnormal finding       Vaginal atrophy       Relevant Medications   estradiol (ESTRACE VAGINAL) 0.1 MG/GM vaginal cream       1) Mammogram - recommend yearly screening mammogram.  Mammogram  was ordered today  2) STI screening was offered and declined  3) Cervical cancer screening-Pap smear up-to-date  4) Colonoscopy --colonoscopy performed in 2021.  Severe follow-up advised.    5) Routine healthcare maintenance including cholesterol, diabetes screening discussed managed by PCP  6) Osteoporosis screening - No increased risk factors.  Initiate screening at 17.  7) Vulvovaginal atrophy- Discussed options for over-the-counter again hormonal treatments.  Patient is interested in initiation of estradiol cream.  Prescription sent.  Adrian Prows MD, [facility], [facility] 06/27/2021 10:39 AM

## 2021-06-27 NOTE — Patient Instructions (Signed)
Vaginal/ Vulvar Moisturizer Use 3-5 times a week at bedtime  Hyalo Gyn Revaree Replens Isaiah Blakes Key-E suppositories Vitamin E oil, olive oil, coconut oil   Water-based Lubricants  Astro[place]ide [place] Jelly  Luvena Aquagel  Silicone- based [place] [facility] Recommended Dietary Allowances for Calcium and Vitamin D  Age (yr) Calcium Recommended Dietary Allowance (mg/day) Vitamin D Recommended Dietary Allowance (international units/day)  9-18 1,300 600  19-50 1,000 600  51-70 1,200 600  71 and older 1,200 800  Data from Institute of Medicine. Dietary reference intakes: calcium, vitamin D. [place], [place]: [employer]; 2011.   Exercising to Stay Healthy To become healthy and stay healthy, it is recommended that you do moderate-intensity and vigorous-intensity exercise. You can tell that you are exercising at a moderate intensity if your heart starts beating faster and you start breathing faster but can still hold a conversation. You can tell that you are exercising at a vigorous intensity if you are breathing much harder and faster and cannot hold a conversation while exercising. How can exercise benefit me? Exercising regularly is important. It has many health benefits, such as: Improving overall fitness, flexibility, and endurance. Increasing bone density. Helping with weight control. Decreasing body fat. Increasing muscle strength and endurance. Reducing stress and tension, anxiety, depression, or anger. Improving overall health. What guidelines should I follow while exercising? Before you start a new exercise program, talk with your health care provider. Do not exercise so much that you hurt yourself, feel dizzy, or get very short of breath. Wear comfortable clothes and wear shoes with good support. Drink plenty of water while you exercise to prevent dehydration or heat stroke. Work out until your  breathing and your heartbeat get faster (moderate intensity). How often should I exercise? Choose an activity that you enjoy, and set realistic goals. Your health care provider can help you make an activity plan that is individually designed and works best for you. Exercise regularly as told by your health care provider. This may include: Doing strength training two times a week, such as: Lifting weights. Using resistance bands. Push-ups. Sit-ups. Yoga. Doing a certain intensity of exercise for a given amount of time. Choose from these options: A total of 150 minutes of moderate-intensity exercise every week. A total of 75 minutes of vigorous-intensity exercise every week. A mix of moderate-intensity and vigorous-intensity exercise every week. Children, pregnant women, people who have not exercised regularly, people who are overweight, and older adults may need to talk with a health care provider about what activities are safe to perform. If you have a medical condition, be sure to talk with your health care provider before you start a new exercise program. What are some exercise ideas? Moderate-intensity exercise ideas include: Walking 1 mile (1.6 km) in about 15 minutes. Biking. Hiking. Golfing. Dancing. Water aerobics. Vigorous-intensity exercise ideas include: Walking 4.5 miles (7.2 km) or more in about 1 hour. Jogging or running 5 miles (8 km) in about 1 hour. Biking 10 miles (16.1 km) or more in about 1 hour. Lap swimming. Roller-skating or in-line skating. Cross-country skiing. Vigorous competitive sports, such as football, basketball, and soccer. Jumping rope. Aerobic dancing. What are some everyday activities that can help me get exercise? Yard work, such as: Psychologist, educational. Raking and bagging leaves. Washing your car. Pushing a stroller. Shoveling snow. Gardening. Washing windows or floors. How can I be more active in my  day-to-day activities? Use stairs  instead of an elevator. Take a walk during your lunch break. If you drive, park your car farther away from your work or school. If you take public transportation, get off one stop early and walk the rest of the way. Stand up or walk around during all of your indoor phone calls. Get up, stretch, and walk around every 30 minutes throughout the day. Enjoy exercise with a friend. Support to continue exercising will help you keep a regular routine of activity. Where to find more information You can find more information about exercising to stay healthy from: U.S. Department of Health and Human Services: [employer].at Centers for Disease Control and Prevention (CDC): http://www.wolf.info/ Summary Exercising regularly is important. It will improve your overall fitness, flexibility, and endurance. Regular exercise will also improve your overall health. It can help you control your weight, reduce stress, and improve your bone density. Do not exercise so much that you hurt yourself, feel dizzy, or get very short of breath. Before you start a new exercise program, talk with your health care provider. This information is not intended to replace advice given to you by your health care provider. Make sure you discuss any questions you have with your health care provider. Document Revised: 01/24/2021 Document Reviewed: 01/24/2021 Elsevier Patient Education  2022 [place] Eating There are many ways to save money at the grocery store and continue to eat healthy. You can be successful if you: Plan meals according to your budget. Make a grocery list and only purchase food according to your grocery list. Prepare food yourself at home. What are tips for following this plan? Reading food labels Compare food labels between brand name foods and the store brand. Often the nutritional value is the same, but the store brand is lower cost. Look for products that do not have added sugar, fat, or salt  (sodium). These often cost the same but are healthier for you. Products may be labeled as: Sugar-free. Nonfat. Low-fat. Sodium-free. Low-sodium. Look for lean ground beef labeled as at least 92% lean and 8% fat. Shopping  Buy only the items on your grocery list and go only to the areas of the store that have the items on your list. Use coupons only for foods and brands you normally buy. Avoid buying items you wouldn't normally buy simply because they are on sale. Check online and in newspapers for weekly deals. Buy healthy items from the bulk bins when available, such as herbs, spices, flour, pasta, nuts, and dried fruit. Buy fruits and vegetables that are in season. Prices are usually lower on in-season produce. Look at the unit price on the price tag. Use it to compare different brands and sizes to find out which item is the best deal. Choose healthy items that are often low-cost, such as carrots, potatoes, apples, bananas, and oranges. Dried or canned beans are a low-cost protein source. Buy in bulk and freeze extra food. Items you can buy in bulk include meats, fish, poultry, frozen fruits, and frozen vegetables. Avoid buying "ready-to-eat" foods, such as pre-cut fruits and vegetables and pre-made salads. If possible, shop around to discover where you can find the best prices. Consider other retailers such as dollar stores, larger [employer], local fruit and vegetable stands, and farmers markets. Do not shop when you are hungry. If you shop while hungry, it may be hard to stick to your list and budget. Resist impulse buying. Use your grocery list as your official  plan for the week. Buy a variety of vegetables and fruits by purchasing fresh, frozen, and canned items. Look at the top and bottom shelves for deals. Foods at eye level (eye level of an adult or child) are usually more expensive. Be efficient with your time when shopping. The more time you spend at the store, the more money  you are likely to spend. To save money when choosing more expensive foods like meats and dairy: Choose cheaper cuts of meat, such as bone-in chicken thighs and drumsticks instead of skinless and boneless chicken. When you are ready to prepare the chicken, you can remove the skin yourself to make it healthier. Choose lean meats like chicken or [place] instead of beef. Choose canned seafood, such as tuna, salmon, or sardines. Buy eggs as a low-cost source of protein. Buy dried beans and peas, such as lentils, split peas, or kidney beans instead of meats. Dried beans and peas are a good alternative source of protein. Buy the larger tubs of yogurt instead of individual-sized containers. Choose water instead of sodas and other sweetened beverages. Avoid buying chips, cookies, and other "junk food." These items are usually expensive and not healthy. Cooking Make extra food and freeze the extras in meal-sized containers or in individual portions for fast meals and snacks. Pre-cook on days when you have extra time to prepare meals in advance. You can keep these meals in the fridge or freezer and reheat for a quick meal. When you come home from the grocery store, wash, peel, and cut fruits and vegetables so they are ready to use and eat. This will help reduce food waste. Meal planning Do not eat out or get fast food. Prepare food at home. Make a grocery list and make sure to bring it with you to the store. If you have a smart phone, you could use your phone to create your shopping list. Plan meals and snacks according to a grocery list and budget you create. Use leftovers in your meal plan for the week. Look for recipes where you can cook once and make enough food for two meals. Prepare budget-friendly types of meals like stews, casseroles, and stir-fry dishes. Try some meatless meals or try "no cook" meals like salads. Make sure that half your plate is filled with fruits or vegetables. Choose from  fresh, frozen, or canned fruits and vegetables. If eating canned, remember to rinse them before eating. This will remove any excess salt added for packaging. Summary Eating healthy on a budget is possible if you plan your meals according to your budget, purchase according to your budget and grocery list, and prepare food yourself. Tips for buying more food on a limited budget include buying generic brands, using coupons only for foods you normally buy, and buying healthy items from the bulk bins when available. Tips for buying cheaper food to replace expensive food include choosing cheaper, lean cuts of meat, and buying dried beans and peas. This information is not intended to replace advice given to you by your health care provider. Make sure you discuss any questions you have with your health care provider. Document Revised: 07/11/2020 Document Reviewed: 07/11/2020 Elsevier Patient Education  [place] protect organs, store calcium, anchor muscles, and support the whole body. Keeping your bones strong is important, especially as you get older. You can take actions to help keep your bones strong and healthy. Why is keeping my bones healthy important? Keeping your bones healthy is important because  your body constantly replaces bone cells. Cells get old, and new cells take their place. As we age, we lose bone cells because the body may not be able to make enough new cells to replace the old cells. The amount of bone cells and bone tissue you have is referred to as bone mass. The higher your bone mass, the stronger your bones. The aging process leads to an overall loss of bone mass in the body, which can increase the likelihood of: Joint pain and stiffness. Broken bones. A condition in which the bones become weak and brittle (osteoporosis). A large decline in bone mass occurs in older adults. In women, it occurs about the time of menopause. What actions can I take to keep  my bones healthy? Good health habits are important for maintaining healthy bones. This includes eating nutritious foods and exercising regularly. To have healthy bones, you need to get enough of the right minerals and vitamins. Most nutrition experts recommend getting these nutrients from the foods that you eat. In some cases, taking supplements may also be recommended. Doing certain types of exercise is also important for bone health. What are the nutritional recommendations for healthy bones? Eating a well-balanced diet with plenty of calcium and vitamin D will help to protect your bones. Nutritional recommendations vary from person to person. Ask your health care provider what is healthy for you. Here are some general guidelines. Get enough calcium Calcium is the most important (essential) mineral for bone health. Most people can get enough calcium from their diet, but supplements may be recommended for people who are at risk for osteoporosis. Good sources of calcium include: Dairy products, such as low-fat or nonfat milk, cheese, and yogurt. Dark green leafy vegetables, such as bok choy and broccoli. Calcium-fortified foods, such as orange juice, cereal, bread, soy beverages, and tofu products. Nuts, such as almonds. Follow these recommended amounts for daily calcium intake: Children, age 61-3: 700 mg. Children, age 43-8: 1,000 mg. Children, age 611-13: 1,300 mg. Teens, age 35-18: 1,300 mg. Adults, age 91-50: 1,000 mg. Adults, age 59-70: Men: 1,000 mg. Women: 1,200 mg. Adults, age 69 or older: 1,200 mg. Pregnant and breastfeeding females: Teens: 1,300 mg. Adults: 1,000 mg. Get enough vitamin D Vitamin D is the most essential vitamin for bone health. It helps the body absorb calcium. Sunlight stimulates the skin to make vitamin D, so be sure to get enough sunlight. If you live in a cold climate or you do not get outside often, your health care provider may recommend that you take vitamin D  supplements. Good sources of vitamin D in your diet include: Egg yolks. Saltwater fish. Milk and cereal fortified with vitamin D. Follow these recommended amounts for daily vitamin D intake: Children and teens, age 61-18: 600 international units. Adults, age 65 or younger: 400-800 international units. Adults, age 9 or older: 800-1,000 international units. Get other important nutrients Other nutrients that are important for bone health include: Phosphorus. This mineral is found in meat, poultry, dairy foods, nuts, and legumes. The recommended daily intake for adult men and adult women is 700 mg. Magnesium. This mineral is found in seeds, nuts, dark green vegetables, and legumes. The recommended daily intake for adult men is 400-420 mg. For adult women, it is 310-320 mg. Vitamin K. This vitamin is found in green leafy vegetables. The recommended daily intake is 120 mg for adult men and 90 mg for adult women. What type of physical activity is best for building and  maintaining healthy bones? Weight-bearing and strength-building activities are important for building and maintaining healthy bones. Weight-bearing activities cause muscles and bones to work against gravity. Strength-building activities increase the strength of the muscles that support bones. Weight-bearing and muscle-building activities include: Walking and hiking. Jogging and running. Dancing. Gym exercises. Lifting weights. Tennis and racquetball. Climbing stairs. Aerobics. Adults should get at least 30 minutes of moderate physical activity on most days. Children should get at least 60 minutes of moderate physical activity on most days. Ask your health care provider what type of exercise is best for you. How can I find out if my bone mass is low? Bone mass can be measured with an X-ray test called a bone mineral density (BMD) test. This test is recommended for all women who are age 83 or older. It may also be recommended for: Men  who are age 63 or older. People who are at risk for osteoporosis because of: Having bones that break easily. Having a long-term disease that weakens bones, such as kidney disease or rheumatoid arthritis. Having menopause earlier than normal. Taking medicine that weakens bones, such as steroids, thyroid hormones, or hormone treatment for breast cancer or prostate cancer. Smoking. Drinking three or more alcoholic drinks a day. If you find that you have a low bone mass, you may be able to prevent osteoporosis or further bone loss by changing your diet and lifestyle. Where can I find more information? For more information, check out the following websites: [place]: [employer]fr [employer] of Health: www.bones.SouthExposed.es International Osteoporosis Foundation: Administrator.iofbonehealth.org Summary The aging process leads to an overall loss of bone mass in the body, which can increase the likelihood of broken bones and osteoporosis. Eating a well-balanced diet with plenty of calcium and vitamin D will help to protect your bones. Weight-bearing and strength-building activities are also important for building and maintaining strong bones. Bone mass can be measured with an X-ray test called a bone mineral density (BMD) test. This information is not intended to replace advice given to you by your health care provider. Make sure you discuss any questions you have with your health care provider. Document Revised: 10/25/2017 Document Reviewed: 10/25/2017 Elsevier Patient Education  2022 [employer].

## 2022-05-29 IMAGING — CT CT ABDOMEN W/ CM
2 of 5 series · 16 of 46 positions shown, 18 images · IV contrast (APPLIED)
Comparison: 12/24/2009

CLINICAL DATA: Right upper quadrant pain and tenderness for years,
intermittently, no trauma

EXAM:
CT ABDOMEN WITH CONTRAST
TECHNIQUE: Multidetector CT imaging of the abdomen was performed using the
standard protocol following bolus administration of intravenous
contrast.
CONTRAST:  100mL OMNIPAQUE IOHEXOL 300 MG/ML SOLN, additional oral
enteric contrast

[Series 2: routine abd/pel with · axial · 0.73mm/px · z∈[-634,-434]mm · 13 of 48 slices shown, 15 images]
[im 4/48  soft-tissue]
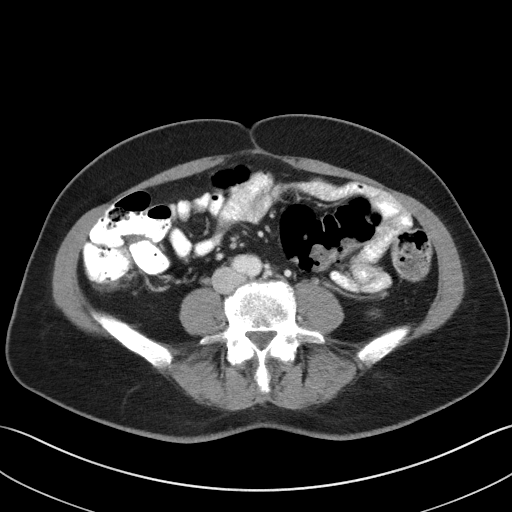
[im 4/48  bone]
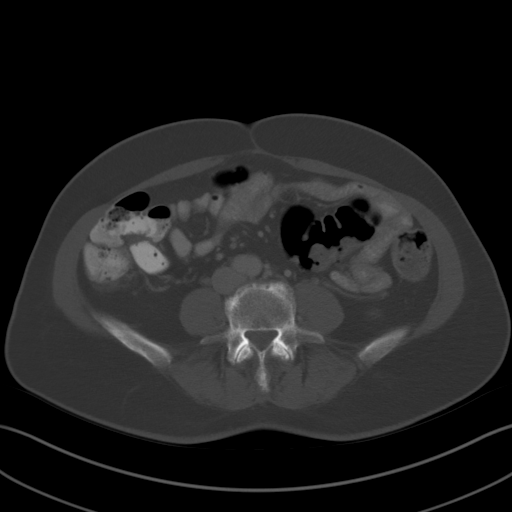
[im 7/48  soft-tissue]
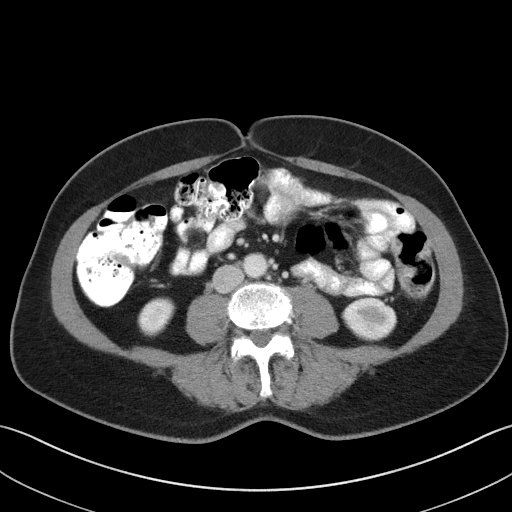
[im 11/48  soft-tissue]
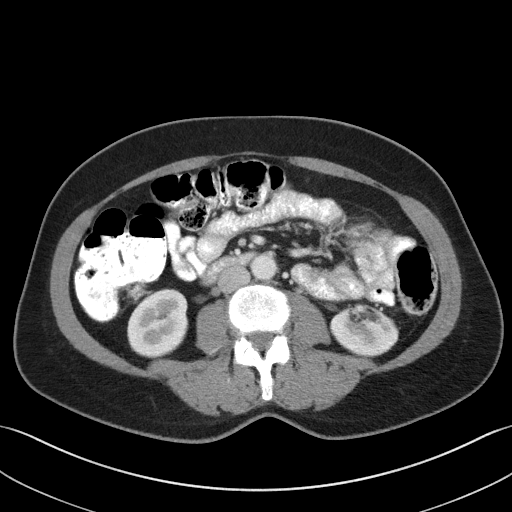
[im 14/48  soft-tissue]
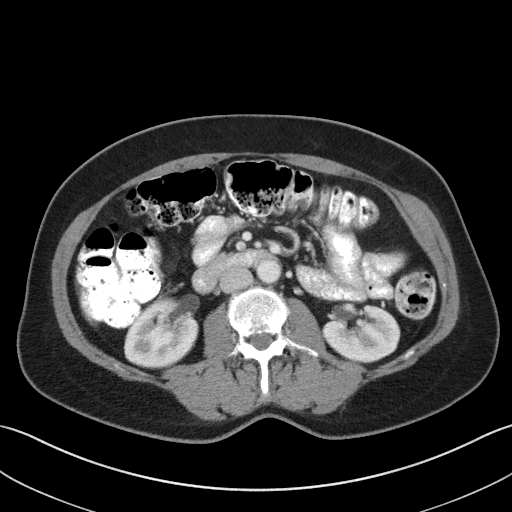
[im 17/48  soft-tissue]
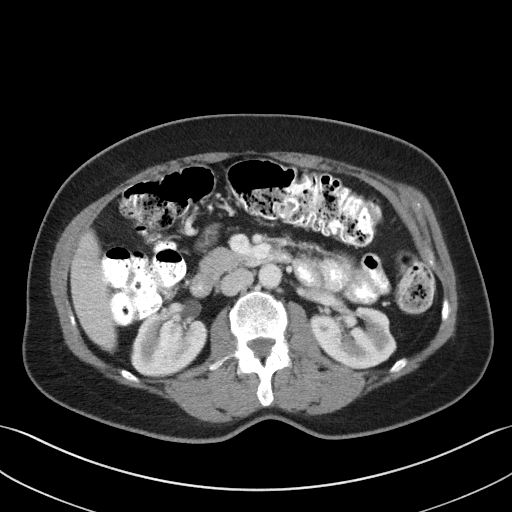
[im 21/48  soft-tissue]
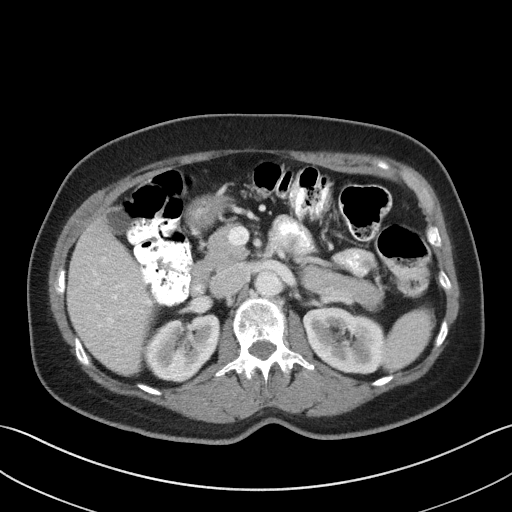
[im 24/48  soft-tissue]
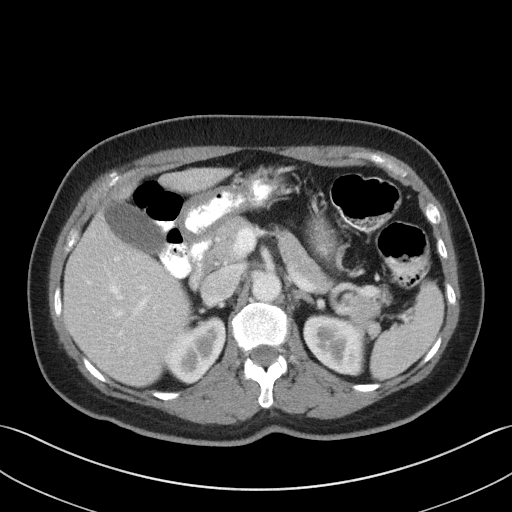
[im 27/48  soft-tissue]
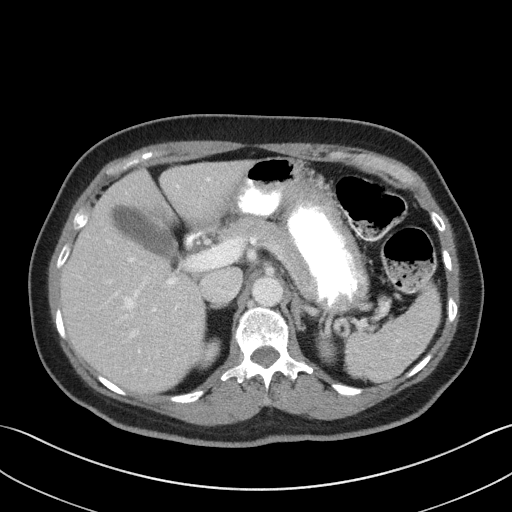
[im 31/48  soft-tissue]
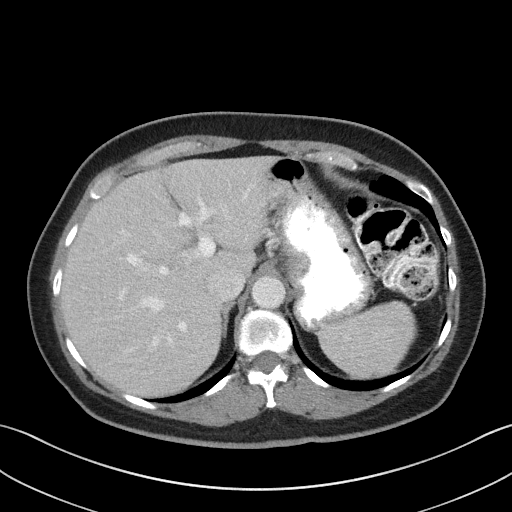
[im 31/48  bone]
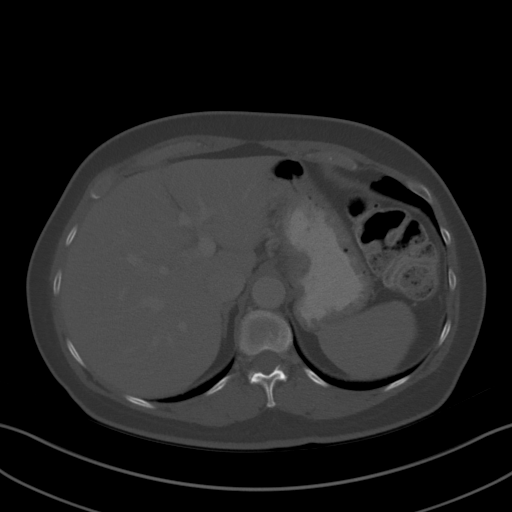
[im 34/48  soft-tissue]
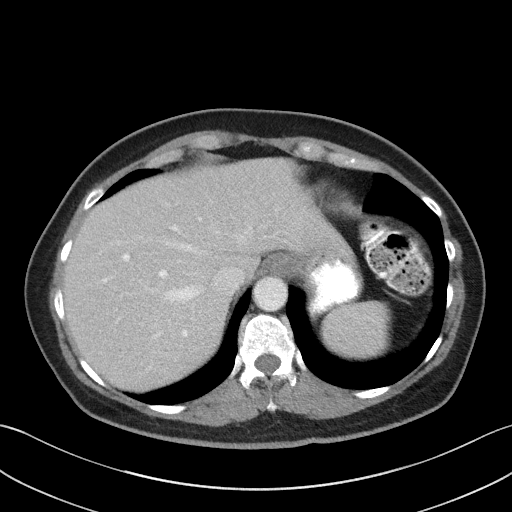
[im 37/48  soft-tissue]
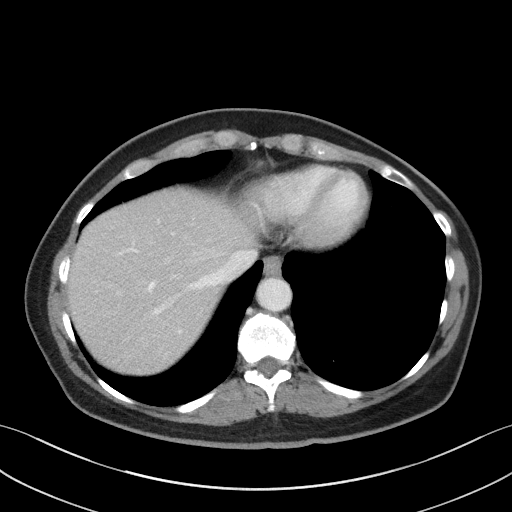
[im 41/48  soft-tissue]
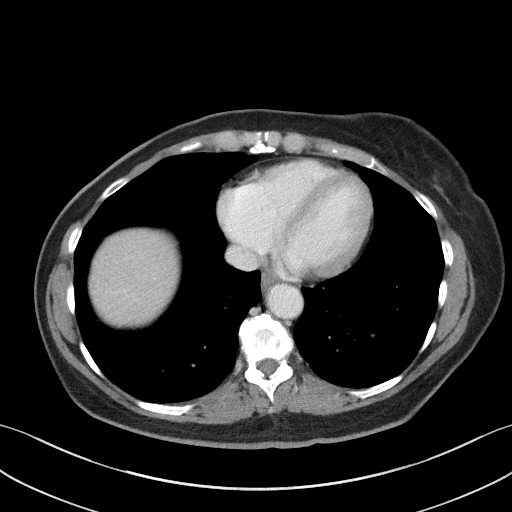
[im 44/48  soft-tissue]
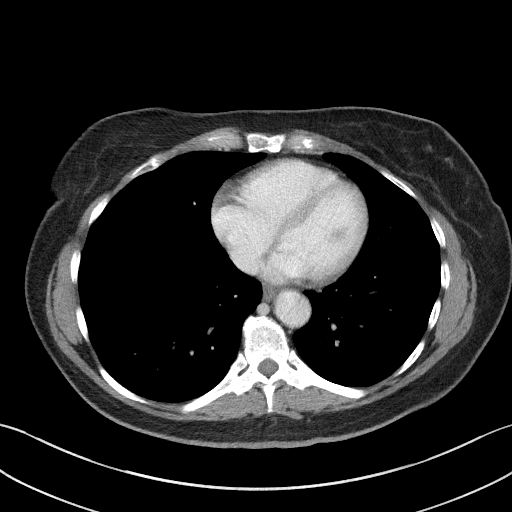

[Series 5: coronal st · coronal · 0.49mm/px · 3 of 86 slices shown]
[im 29/86  soft-tissue]
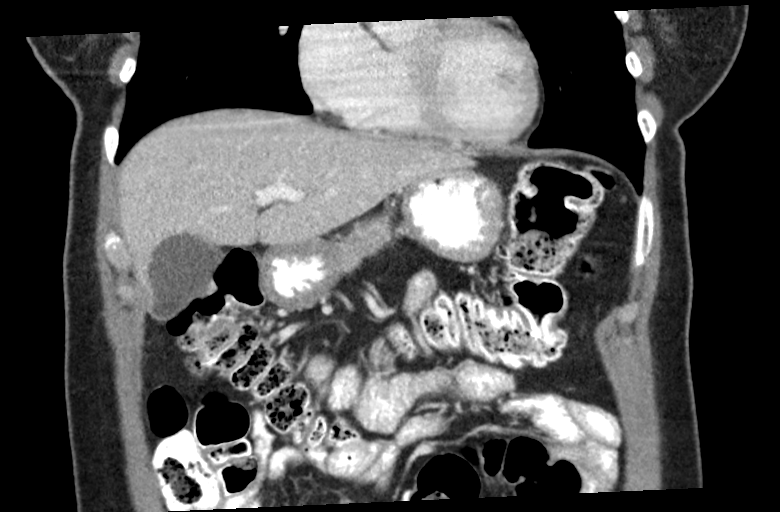
[im 38/86  soft-tissue]
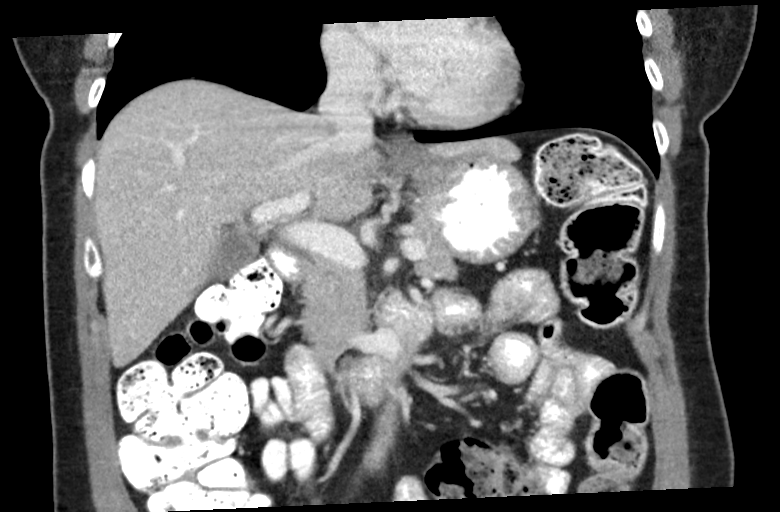
[im 48/86  soft-tissue]
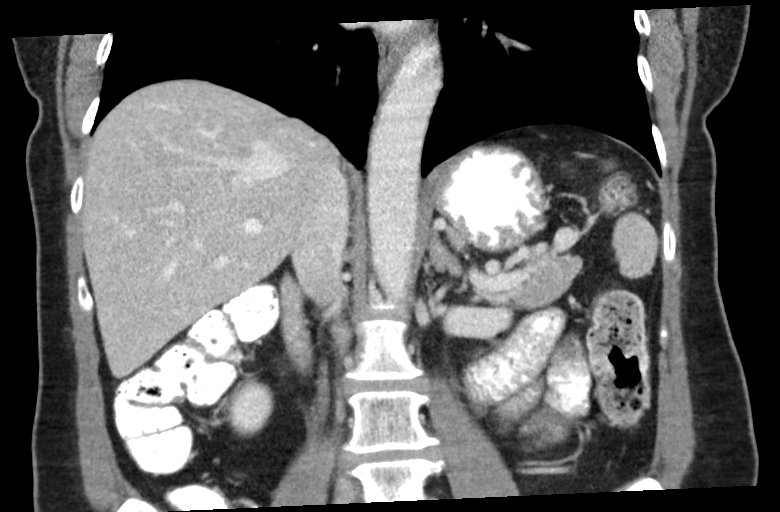

[16 of 46 positions shown; findings below may reference images not displayed]

FINDINGS: Lower chest: No acute abnormality.

Hepatobiliary: No focal liver abnormality is seen. No gallstones,
gallbladder wall thickening, or biliary dilatation.

Pancreas: Unremarkable. No pancreatic ductal dilatation or
surrounding inflammatory changes.

Spleen: Normal in size without focal abnormality.

Adrenals/Urinary Tract: Adrenal glands are unremarkable. Kidneys are
normal, without renal calculi, focal lesion, or hydronephrosis.
Bladder is unremarkable.

Stomach/Bowel: Stomach is within normal limits. Appendix appears
normal. No evidence of bowel wall thickening, distention, or
inflammatory changes.

Vascular/Lymphatic: No significant vascular findings are present. No
enlarged abdominal lymph nodes.

Other: No abdominal wall hernia or abnormality. No abdominopelvic
ascites.

Musculoskeletal: No acute or significant osseous findings.
IMPRESSION: No CT findings of the abdomen to explain right upper quadrant pain.
